# Patient Record
Sex: Female | Born: 1995 | Race: Black or African American | Hispanic: No | Marital: Single | Smoking: Never smoker
Health system: Southern US, Community
[De-identification: ages and names within clinical notes are randomized; demographics above are authoritative.]

## PROBLEM LIST (undated history)

## (undated) DIAGNOSIS — J302 Other seasonal allergic rhinitis: Secondary | ICD-10-CM

---

## 2013-08-30 ENCOUNTER — Encounter (HOSPITAL_BASED_OUTPATIENT_CLINIC_OR_DEPARTMENT_OTHER): Payer: Self-pay | Admitting: *Deleted

## 2013-08-30 ENCOUNTER — Emergency Department (HOSPITAL_BASED_OUTPATIENT_CLINIC_OR_DEPARTMENT_OTHER)
Admission: EM | Admit: 2013-08-30 | Discharge: 2013-08-30 | Disposition: A | Discharge status: Home / Self Care | Payer: Medicaid Other | Attending: Emergency Medicine | Admitting: Emergency Medicine

## 2013-08-30 DIAGNOSIS — M545 Low back pain, unspecified: Secondary | ICD-10-CM | POA: Insufficient documentation

## 2013-08-30 DIAGNOSIS — R079 Chest pain, unspecified: Secondary | ICD-10-CM | POA: Insufficient documentation

## 2013-08-30 DIAGNOSIS — M549 Dorsalgia, unspecified: Secondary | ICD-10-CM

## 2013-08-30 MED ORDER — ACETAMINOPHEN-CODEINE #3 300-30 MG PO TABS: 1.0000 | ORAL_TABLET | Freq: Four times a day (QID) | ORAL | Status: DC | PRN

## 2013-08-30 NOTE — ED Notes (Signed)
Patient states she has a two or greater month history of lower back and chest pain.  States she has been seen at [facility] for the same, and her work up has been negative.

## 2013-08-30 NOTE — ED Notes (Signed)
In to d/c pt, pt requested RTW in 2 days-EDP Nanvati notified-advised pt could RTW today-pt notified

## 2013-08-30 NOTE — ED Provider Notes (Signed)
CSN: [id number]     Arrival date & time 08/30/13  1337 History   First MD Initiated Contact with Patient 08/30/13 1410     Chief Complaint  Patient presents with  . Back Pain   (Consider location/radiation/quality/duration/timing/severity/associated sxs/prior Treatment) HPI Comments: Pt comes in with cc of back pain, chest pain. Pt has no medical hx. No family hx of premature CAD. Pain is intermittent x 2 months, usually comes about every other day. The chest pain is sharp, non exertional, not pleuritic and resolves with pressure to the chest. No cough. Back pain is lateral lumbar. No uti like sx. No vaginal discharge, bleeding.   Patient is a 17 y.o. female presenting with back pain. The history is provided by the patient.  Back Pain Associated symptoms: chest pain   Associated symptoms: no abdominal pain, no dysuria and no headaches     History reviewed. No pertinent past medical history. History reviewed. No pertinent past surgical history. No family history on file. History  Substance Use Topics  . Smoking status: Never Smoker   . Smokeless tobacco: Not on file  . Alcohol Use: No   OB History   Grav Para Term Preterm Abortions TAB SAB Ect Mult Living                 Review of Systems  Constitutional: Negative for activity change.  HENT: Negative for neck pain.   Respiratory: Negative for shortness of breath.   Cardiovascular: Positive for chest pain.  Gastrointestinal: Negative for nausea, vomiting and abdominal pain.  Genitourinary: Negative for dysuria.  Musculoskeletal: Positive for back pain.  Neurological: Negative for headaches.    Allergies  Review of patient's allergies indicates no known allergies.  Home Medications   Current Outpatient Rx  Name  Route  Sig  Dispense  Refill  . fluticasone (FLONASE) 50 MCG/ACT nasal spray   Nasal   Place 2 sprays into the nose as needed for rhinitis.         Marland Kitchen loratadine (CLARITIN) 10 MG tablet   Oral   Take 10  mg by mouth daily.         . medroxyPROGESTERone (DEPO-PROVERA) 150 MG/ML injection   Intramuscular   Inject 150 mg into the muscle every 3 (three) months.         Marland Kitchen acetaminophen-codeine (TYLENOL #3) 300-30 MG per tablet   Oral   Take 1 tablet by mouth every 6 (six) hours as needed for pain.   15 tablet   0    BP 122/53  Pulse 89  Temp(Src) 98 F (36.7 C) (Oral)  Resp 18  Ht 5\' 6"  (1.676 m)  Wt 150 lb (68.04 kg)  BMI 24.22 kg/m2  SpO2 100% Physical Exam  Nursing note and vitals reviewed. Constitutional: She is oriented to person, place, and time. She appears well-developed and well-nourished.  HENT:  Head: Normocephalic and atraumatic.  Eyes: EOM are normal. Pupils are equal, round, and reactive to light.  Neck: Neck supple.  Cardiovascular: Normal rate, regular rhythm and normal heart sounds.   No murmur heard. Pulmonary/Chest: Effort normal. No respiratory distress.  Abdominal: Soft. She exhibits no distension. There is no tenderness. There is no rebound and no guarding.  Musculoskeletal:  Pt has tenderness over the lumbar region No step offs, no erythema. Pt has 2+ patellar reflex bilaterally. Able to discriminate between sharp and dull. Able to ambulate   Neurological: She is alert and oriented to person, place, and time.  Skin:  Skin is warm and dry.    ED Course  Procedures (including critical care time) Labs Review Labs Reviewed - No data to display Imaging Review No results found.  MDM   1. Back pain    Pt comes in with cc of back pain, chest pain.  Intermittent back and chest pain.  She has 0 red flags for back or chest disease that can be emergent or life threatning.  No uti like sx, no no GU complains.  Requested Up[employer] - but she has work at 3 om, and rather get out by then.  Chest paib- No concerning fam hx/ PERC and WELLS score are 0. Xray at [facility] per patient 2 weeks ago - negative.  Will ask for PCP f/u. She has insurance and  a PCP.  Derwood Kaplan, MD 08/30/13 1510

## 2015-05-27 ENCOUNTER — Emergency Department (HOSPITAL_BASED_OUTPATIENT_CLINIC_OR_DEPARTMENT_OTHER)
Admission: EM | Admit: 2015-05-27 | Discharge: 2015-05-27 | Disposition: A | Discharge status: Home / Self Care | Payer: Medicaid Other | Attending: Emergency Medicine | Admitting: Emergency Medicine

## 2015-05-27 ENCOUNTER — Encounter (HOSPITAL_BASED_OUTPATIENT_CLINIC_OR_DEPARTMENT_OTHER): Payer: Self-pay | Admitting: Emergency Medicine

## 2015-05-27 DIAGNOSIS — D72829 Elevated white blood cell count, unspecified: Secondary | ICD-10-CM | POA: Diagnosis not present

## 2015-05-27 DIAGNOSIS — Z79899 Other long term (current) drug therapy: Secondary | ICD-10-CM | POA: Insufficient documentation

## 2015-05-27 DIAGNOSIS — Z8709 Personal history of other diseases of the respiratory system: Secondary | ICD-10-CM | POA: Diagnosis not present

## 2015-05-27 DIAGNOSIS — N390 Urinary tract infection, site not specified: Secondary | ICD-10-CM | POA: Insufficient documentation

## 2015-05-27 DIAGNOSIS — Z3202 Encounter for pregnancy test, result negative: Secondary | ICD-10-CM | POA: Diagnosis not present

## 2015-05-27 DIAGNOSIS — R300 Dysuria: Secondary | ICD-10-CM | POA: Diagnosis present

## 2015-05-27 HISTORY — DX: Other seasonal allergic rhinitis: J30.2

## 2015-05-27 LAB — URINALYSIS, ROUTINE W REFLEX MICROSCOPIC
GLUCOSE, UA: NEGATIVE mg/dL
KETONES UR: 15 mg/dL — AB
Nitrite: NEGATIVE
PROTEIN: 100 mg/dL — AB
Specific Gravity, Urine: 1.03 (ref 1.005–1.030)
Urobilinogen, UA: 1 mg/dL (ref 0.0–1.0)
pH: 6 (ref 5.0–8.0)

## 2015-05-27 LAB — URINE MICROSCOPIC-ADD ON

## 2015-05-27 LAB — PREGNANCY, URINE: PREG TEST UR: NEGATIVE

## 2015-05-27 MED ORDER — CEPHALEXIN 500 MG PO CAPS: 500.0000 mg | ORAL_CAPSULE | Freq: Two times a day (BID) | ORAL | Status: DC

## 2015-05-27 MED ORDER — PHENAZOPYRIDINE HCL 200 MG PO TABS: 200.0000 mg | ORAL_TABLET | Freq: Three times a day (TID) | ORAL | Status: DC

## 2015-05-27 NOTE — ED Notes (Signed)
Burning with urination x 2 days

## 2015-05-27 NOTE — ED Provider Notes (Signed)
CSN: [id number]     Arrival date & time 05/27/15  1716 History   First MD Initiated Contact with Patient 05/27/15 1855     Chief Complaint  Patient presents with  . Dysuria     (Consider location/radiation/quality/duration/timing/severity/associated sxs/prior Treatment) Patient is a 19 y.o. female presenting with dysuria. The history is provided by the patient. No language interpreter was used.  Dysuria Pain quality:  Burning Pain severity:  Moderate Onset quality:  Sudden Duration:  2 days Timing:  Intermittent Progression:  Unchanged Chronicity:  Recurrent Recent urinary tract infections: yes (3-4 weeks ago, does not recall name of antibiotic she was on but symptoms resolved completely)   Relieved by:  None tried Worsened by:  Nothing tried Ineffective treatments:  None tried Urinary symptoms: frequent urination   Associated symptoms: no abdominal pain, no fever, no flank pain, no nausea, no vaginal discharge and no vomiting    Pt is a [place] female presenting to ED with c/o dysuria, urgency and frequency that started 2 days ago. Pt denies abdominal pain, hematuria, back pain, fever, chills, n/v. Denies vaginal symptoms. Denies concern for STD.  LMP: 05/13/15  Past Medical History  Diagnosis Date  . Seasonal allergies    History reviewed. No pertinent past surgical history. No family history on file. History  Substance Use Topics  . Smoking status: Never Smoker   . Smokeless tobacco: Not on file  . Alcohol Use: No   OB History    No data available     Review of Systems  Constitutional: Negative for fever, diaphoresis, appetite change and fatigue.  Gastrointestinal: Negative for nausea, vomiting, abdominal pain and diarrhea.  Genitourinary: Positive for dysuria, urgency and frequency. Negative for hematuria, flank pain, decreased urine volume, vaginal bleeding, vaginal discharge, vaginal pain, menstrual problem and pelvic pain.  Musculoskeletal: Negative for myalgias and  back pain.  All other systems reviewed and are negative.     Allergies  Review of patient's allergies indicates no known allergies.  Home Medications   Prior to Admission medications   Medication Sig Start Date End Date Taking? Authorizing Provider  acetaminophen-codeine (TYLENOL #3) 300-30 MG per tablet Take 1 tablet by mouth every 6 (six) hours as needed for pain. 08/30/13   Derwood Kaplan, MD  cephALEXin (KEFLEX) 500 MG capsule Take 1 capsule (500 mg total) by mouth 2 (two) times daily. For 7 days 05/27/15   Junius Finner, PA-C  fluticasone [facility]) 50 MCG/ACT nasal spray Place 2 sprays into the nose as needed for rhinitis.    Historical Provider, MD  loratadine (CLARITIN) 10 MG tablet Take 10 mg by mouth daily.    Historical Provider, MD  medroxyPROGESTERone (DEPO-PROVERA) 150 MG/ML injection Inject 150 mg into the muscle every 3 (three) months.    Historical Provider, MD  phenazopyridine (PYRIDIUM) 200 MG tablet Take 1 tablet (200 mg total) by mouth 3 (three) times daily. 05/27/15   Junius Finner, PA-C   BP 110/72 mmHg  Pulse 60  Temp(Src) 98.6 F (37 C) (Oral)  Resp 16  Ht  (1.626 m)  Wt 160 lb (72.576 kg)  BMI 27.45 kg/m2  SpO2 100%  LMP 05/13/2015 (Approximate) Physical Exam  Constitutional: She appears well-developed and well-nourished. No distress.  HENT:  Head: Normocephalic and atraumatic.  Eyes: Conjunctivae are normal. No scleral icterus.  Neck: Normal range of motion.  Cardiovascular: Normal rate, regular rhythm and normal heart sounds.   Pulmonary/Chest: Effort normal and breath sounds normal. No respiratory distress. She has  no wheezes. She has no rales. She exhibits no tenderness.  Abdominal: Soft. Bowel sounds are normal. She exhibits no distension and no mass. There is no tenderness. There is no rebound, no guarding and no CVA tenderness.  Soft, non-distended, non-tender. No CVAT  Genitourinary:  Pt declined  Musculoskeletal: Normal range of motion.   Neurological: She is alert.  Skin: Skin is warm and dry. She is not diaphoretic.  Nursing note and vitals reviewed.   ED Course  Procedures (including critical care time) Labs Review Labs Reviewed  URINALYSIS, ROUTINE W REFLEX MICROSCOPIC (NOT AT [facility]) - Abnormal; Notable for the following:    APPearance TURBID (*)    Hgb urine dipstick LARGE (*)    Bilirubin Urine SMALL (*)    Ketones, ur 15 (*)    Protein, ur 100 (*)    Leukocytes, UA LARGE (*)    All other components within normal limits  URINE MICROSCOPIC-ADD ON - Abnormal; Notable for the following:    Squamous Epithelial / LPF FEW (*)    Bacteria, UA FEW (*)    All other components within normal limits  URINE CULTURE  PREGNANCY, URINE  GC/CHLAMYDIA PROBE AMP ([place]) NOT AT [facility]    Imaging Review No results found.   EKG Interpretation None      MDM   Final diagnoses:  UTI (lower urinary tract infection)   Pt is a [place]18yo female presenting to ED with c/o burning with urination. Reports hx of UTI.  Pt appears well, non-toxic,afebrile, NAD. Abdomen is soft, non-tender. No CVAT  UA: evidence of UTI with Hgb large leukocytes and 10-20 WBC. Sperm is present. Pt declined Pelvic exam stating she has never had one before. Denies vaginal symptoms. Urine culture sent. GC/Chlamydia via Urine also sent.   Rx: keflex. Advised to f/u with PCP in 3-4 days for symptom recheck if not improving. Return precautions provided. Pt verbalized understanding and agreement with tx plan.   Junius Finnerrin O'Malley, PA-C 05/27/15 1952  Nelva Nayobert Beaton, MD 06/02/15 2153

## 2015-05-28 LAB — GC/CHLAMYDIA PROBE AMP (~~LOC~~) NOT AT ARMC
Chlamydia: POSITIVE — AB
Neisseria Gonorrhea: POSITIVE — AB

## 2015-05-29 ENCOUNTER — Telehealth (HOSPITAL_BASED_OUTPATIENT_CLINIC_OR_DEPARTMENT_OTHER): Payer: Self-pay | Admitting: Emergency Medicine

## 2015-05-29 LAB — URINE CULTURE: Colony Count: >=100000

## 2015-05-29 NOTE — Telephone Encounter (Signed)
Chart handoff to EDP for treatment plan for chlamydia and gonorrhea

## 2015-05-30 ENCOUNTER — Telehealth: Payer: Self-pay | Admitting: Emergency Medicine

## 2015-05-30 NOTE — Telephone Encounter (Signed)
Post ED Visit - Positive Culture Follow-up  Culture report reviewed by antimicrobial stewardship pharmacist: []  Wes Dulaney, Pharm.D., BCPS [x]  Celedonio MiyamotoJeremy Frens, Pharm.D., BCPS []  Georgina PillionElizabeth Martin, Pharm.D., BCPS []  NormalMinh Pham, [address]Ph[address]D., BCPS, AAHIVP []  Estella HuskMichelle Turner, Pharm.D., BCPS, AAHIVP []  Elder CyphersLorie Poole, [address]Pharm[address]D., BCPS  Positive Urine culture Treated with Cephalexin, organism sensitive to the same and no further patient follow-up is required at this time.  Jiles HaroldGammons, Yanis Larin Chaney 05/30/2015, 1:47 PM

## 2015-05-31 ENCOUNTER — Telehealth: Payer: Self-pay | Admitting: Emergency Medicine

## 2015-06-02 ENCOUNTER — Telehealth (HOSPITAL_BASED_OUTPATIENT_CLINIC_OR_DEPARTMENT_OTHER): Payer: Self-pay | Admitting: Emergency Medicine

## 2015-07-26 ENCOUNTER — Emergency Department (HOSPITAL_BASED_OUTPATIENT_CLINIC_OR_DEPARTMENT_OTHER)
Admission: EM | Admit: 2015-07-26 | Discharge: 2015-07-26 | Disposition: A | Discharge status: Home / Self Care | Payer: Medicaid Other | Attending: Emergency Medicine | Admitting: Emergency Medicine

## 2015-07-26 ENCOUNTER — Encounter (HOSPITAL_BASED_OUTPATIENT_CLINIC_OR_DEPARTMENT_OTHER): Payer: Self-pay | Admitting: *Deleted

## 2015-07-26 DIAGNOSIS — Z8709 Personal history of other diseases of the respiratory system: Secondary | ICD-10-CM | POA: Insufficient documentation

## 2015-07-26 DIAGNOSIS — R51 Headache: Secondary | ICD-10-CM | POA: Diagnosis not present

## 2015-07-26 DIAGNOSIS — R112 Nausea with vomiting, unspecified: Secondary | ICD-10-CM | POA: Diagnosis not present

## 2015-07-26 DIAGNOSIS — Z3202 Encounter for pregnancy test, result negative: Secondary | ICD-10-CM | POA: Insufficient documentation

## 2015-07-26 DIAGNOSIS — Z79899 Other long term (current) drug therapy: Secondary | ICD-10-CM | POA: Insufficient documentation

## 2015-07-26 DIAGNOSIS — R519 Headache, unspecified: Secondary | ICD-10-CM

## 2015-07-26 LAB — URINALYSIS, ROUTINE W REFLEX MICROSCOPIC
Bilirubin Urine: NEGATIVE
GLUCOSE, UA: NEGATIVE mg/dL
HGB URINE DIPSTICK: NEGATIVE
KETONES UR: NEGATIVE mg/dL
LEUKOCYTES UA: NEGATIVE
Nitrite: NEGATIVE
PH: 7 (ref 5.0–8.0)
Protein, ur: NEGATIVE mg/dL
Specific Gravity, Urine: 1.026 (ref 1.005–1.030)
Urobilinogen, UA: 1 mg/dL (ref 0.0–1.0)

## 2015-07-26 LAB — PREGNANCY, URINE: Preg Test, Ur: NEGATIVE

## 2015-07-26 MED ORDER — METOCLOPRAMIDE HCL 5 MG/ML IJ SOLN
10.0000 mg | Freq: Once | INTRAMUSCULAR | Status: AC
  Administered 2015-07-26: 10 mg via INTRAVENOUS
  Filled 2015-07-26: qty 2

## 2015-07-26 MED ORDER — SODIUM CHLORIDE 0.9 % IV SOLN
Freq: Once | INTRAVENOUS | Status: AC
  Administered 2015-07-26: 15:00:00 via INTRAVENOUS

## 2015-07-26 MED ORDER — KETOROLAC TROMETHAMINE 30 MG/ML IJ SOLN
30.0000 mg | Freq: Once | INTRAMUSCULAR | Status: AC
  Administered 2015-07-26: 30 mg via INTRAVENOUS
  Filled 2015-07-26: qty 1

## 2015-07-26 MED ORDER — DIPHENHYDRAMINE HCL 50 MG/ML IJ SOLN
25.0000 mg | Freq: Once | INTRAMUSCULAR | Status: AC
  Administered 2015-07-26: 25 mg via INTRAVENOUS
  Filled 2015-07-26: qty 1

## 2015-07-26 NOTE — ED Provider Notes (Signed)
CSN: [id number]     Arrival date & time 07/26/15  1335 History   First MD Initiated Contact with Patient 07/26/15 1354     Chief Complaint  Patient presents with  . Emesis  . Headache     (Consider location/radiation/quality/duration/timing/severity/associated sxs/prior Treatment) Patient is a 19 y.o. female presenting with headaches. The history is provided by the patient. No language interpreter was used.  Headache Pain location:  Generalized Quality:  Dull Radiates to:  Does not radiate Severity currently:  5/10 Severity at highest:  5/10 Onset quality:  Gradual Timing:  Constant Progression:  Worsening Chronicity:  New Similar to prior headaches: no   Relieved by:  Nothing Worsened by:  Nothing Ineffective treatments:  None tried Associated symptoms: vomiting   Associated symptoms: no fever   Risk factors: lifestyle not sedentary     Past Medical History  Diagnosis Date  . Seasonal allergies    History reviewed. No pertinent past surgical history. No family history on file. History  Substance Use Topics  . Smoking status: Never Smoker   . Smokeless tobacco: Not on file  . Alcohol Use: No   OB History    No data available     Review of Systems  Constitutional: Negative for fever.  Gastrointestinal: Positive for vomiting.  Neurological: Positive for headaches.  All other systems reviewed and are negative.     Allergies  Review of patient's allergies indicates no known allergies.  Home Medications   Prior to Admission medications   Medication Sig Start Date End Date Taking? Authorizing Provider  acetaminophen-codeine (TYLENOL #3) 300-30 MG per tablet Take 1 tablet by mouth every 6 (six) hours as needed for pain. 08/30/13   Derwood Kaplan, MD  cephALEXin (KEFLEX) 500 MG capsule Take 1 capsule (500 mg total) by mouth 2 (two) times daily. For 7 days 05/27/15   Junius Finner, PA-C  fluticasone [facility]) 50 MCG/ACT nasal spray Place 2 sprays into the nose as  needed for rhinitis.    Historical Provider, MD  loratadine (CLARITIN) 10 MG tablet Take 10 mg by mouth daily.    Historical Provider, MD  medroxyPROGESTERone (DEPO-PROVERA) 150 MG/ML injection Inject 150 mg into the muscle every 3 (three) months.    Historical Provider, MD  phenazopyridine (PYRIDIUM) 200 MG tablet Take 1 tablet (200 mg total) by mouth 3 (three) times daily. 05/27/15   Junius Finner, PA-C   BP 137/93 mmHg  Pulse 68  Temp(Src) 98.1 F (36.7 C) (Oral)  Resp 18  Ht  (1.626 m)  Wt 160 lb (72.576 kg)  BMI 27.45 kg/m2  SpO2 99%  LMP 06/10/2015 Physical Exam  Constitutional: She is oriented to person, place, and time. She appears well-developed and well-nourished.  HENT:  Head: Normocephalic and atraumatic.  Right Ear: External ear normal.  Left Ear: External ear normal.  Nose: Nose normal.  Mouth/Throat: Oropharynx is clear and moist.  Eyes: Conjunctivae and EOM are normal. Pupils are equal, round, and reactive to light.  Neck: Normal range of motion.  Cardiovascular: Normal rate and normal heart sounds.   Pulmonary/Chest: Effort normal.  Abdominal: She exhibits no distension.  Musculoskeletal: Normal range of motion.  Neurological: She is alert and oriented to person, place, and time.  Skin: Skin is warm.  Psychiatric: She has a normal mood and affect.  Nursing note and vitals reviewed.   ED Course  Procedures (including critical care time) Labs Review Labs Reviewed  URINALYSIS, ROUTINE W REFLEX MICROSCOPIC (NOT AT [facility])  PREGNANCY, URINE    Imaging Review No results found.   EKG Interpretation None      MDM   Final diagnoses:  Acute nonintractable headache, unspecified headache type    Pt reports nausea and headache resolved with Migraine cocktail. avs   Lonia Skinner [place], PA-C 07/26/15 1811  Nelva Nay, MD 07/27/15 [phone]

## 2015-07-26 NOTE — Discharge Instructions (Signed)

## 2015-07-26 NOTE — ED Notes (Signed)
Reports headache and vomiting since yesterday

## 2015-09-19 ENCOUNTER — Emergency Department (HOSPITAL_BASED_OUTPATIENT_CLINIC_OR_DEPARTMENT_OTHER)
Admission: EM | Admit: 2015-09-19 | Discharge: 2015-09-20 | Disposition: A | Discharge status: Other Acute Inpatient Hospital | Payer: Medicaid Other | Attending: Emergency Medicine | Admitting: Emergency Medicine

## 2015-09-19 ENCOUNTER — Emergency Department (HOSPITAL_BASED_OUTPATIENT_CLINIC_OR_DEPARTMENT_OTHER): Payer: Medicaid Other

## 2015-09-19 ENCOUNTER — Encounter (HOSPITAL_BASED_OUTPATIENT_CLINIC_OR_DEPARTMENT_OTHER): Payer: Self-pay | Admitting: Emergency Medicine

## 2015-09-19 DIAGNOSIS — Z79899 Other long term (current) drug therapy: Secondary | ICD-10-CM | POA: Insufficient documentation

## 2015-09-19 DIAGNOSIS — R1030 Lower abdominal pain, unspecified: Secondary | ICD-10-CM | POA: Diagnosis present

## 2015-09-19 DIAGNOSIS — R52 Pain, unspecified: Secondary | ICD-10-CM

## 2015-09-19 DIAGNOSIS — Z3202 Encounter for pregnancy test, result negative: Secondary | ICD-10-CM | POA: Insufficient documentation

## 2015-09-19 DIAGNOSIS — N730 Acute parametritis and pelvic cellulitis: Secondary | ICD-10-CM | POA: Diagnosis not present

## 2015-09-19 DIAGNOSIS — Z7951 Long term (current) use of inhaled steroids: Secondary | ICD-10-CM | POA: Insufficient documentation

## 2015-09-19 DIAGNOSIS — N7011 Chronic salpingitis: Secondary | ICD-10-CM | POA: Insufficient documentation

## 2015-09-19 LAB — URINALYSIS, ROUTINE W REFLEX MICROSCOPIC
Glucose, UA: NEGATIVE mg/dL
NITRITE: NEGATIVE
PH: 5.5 (ref 5.0–8.0)
Protein, ur: NEGATIVE mg/dL
SPECIFIC GRAVITY, URINE: 1.018 (ref 1.005–1.030)
Urobilinogen, UA: 1 mg/dL (ref 0.0–1.0)

## 2015-09-19 LAB — CBC WITH DIFFERENTIAL/PLATELET
BASOS ABS: 0 10*3/uL (ref 0.0–0.1)
Basophils Relative: 0 %
Eosinophils Absolute: 0 10*3/uL (ref 0.0–0.7)
Eosinophils Relative: 0 %
HEMATOCRIT: 40.7 % (ref 36.0–46.0)
Hemoglobin: 13.6 g/dL (ref 12.0–15.0)
LYMPHS PCT: 11 %
Lymphs Abs: 2.6 10*3/uL (ref 0.7–4.0)
MCH: 29.6 pg (ref 26.0–34.0)
MCHC: 33.4 g/dL (ref 30.0–36.0)
MCV: 88.5 fL (ref 78.0–100.0)
Monocytes Absolute: 2 10*3/uL — ABNORMAL HIGH (ref 0.1–1.0)
Monocytes Relative: 8 %
NEUTROS ABS: 19.7 10*3/uL — AB (ref 1.7–7.7)
Neutrophils Relative %: 81 %
PLATELETS: 322 10*3/uL (ref 150–400)
RBC: 4.6 MIL/uL (ref 3.87–5.11)
RDW: 13.4 % (ref 11.5–15.5)
WBC: 24.3 10*3/uL — AB (ref 4.0–10.5)

## 2015-09-19 LAB — WET PREP, GENITAL
Trich, Wet Prep: NONE SEEN
Yeast Wet Prep HPF POC: NONE SEEN

## 2015-09-19 LAB — COMPREHENSIVE METABOLIC PANEL
ALT: 8 U/L — ABNORMAL LOW (ref 14–54)
ANION GAP: 11 (ref 5–15)
AST: 14 U/L — ABNORMAL LOW (ref 15–41)
Albumin: 4.4 g/dL (ref 3.5–5.0)
Alkaline Phosphatase: 53 U/L (ref 38–126)
BUN: 7 mg/dL (ref 6–20)
CHLORIDE: 100 mmol/L — AB (ref 101–111)
CO2: 26 mmol/L (ref 22–32)
Calcium: 9.2 mg/dL (ref 8.9–10.3)
Creatinine, Ser: 0.63 mg/dL (ref 0.44–1.00)
GFR calc non Af Amer: >60 mL/min (ref >60)
Glucose, Bld: 93 mg/dL (ref 65–99)
POTASSIUM: 3.6 mmol/L (ref 3.5–5.1)
SODIUM: 137 mmol/L (ref 135–145)
Total Bilirubin: 1.1 mg/dL (ref 0.3–1.2)
Total Protein: 7.9 g/dL (ref 6.5–8.1)

## 2015-09-19 LAB — CBG MONITORING, ED: GLUCOSE-CAPILLARY: 84 mg/dL (ref 65–99)

## 2015-09-19 LAB — URINE MICROSCOPIC-ADD ON

## 2015-09-19 LAB — LIPASE, BLOOD: Lipase: 12 U/L — ABNORMAL LOW (ref 22–51)

## 2015-09-19 LAB — I-STAT CG4 LACTIC ACID, ED: LACTIC ACID, VENOUS: 0.52 mmol/L (ref 0.5–2.0)

## 2015-09-19 LAB — PREGNANCY, URINE: PREG TEST UR: NEGATIVE

## 2015-09-19 MED ORDER — SODIUM CHLORIDE 0.9 % IV BOLUS (SEPSIS)
1000.0000 mL | Freq: Once | INTRAVENOUS | Status: AC
  Administered 2015-09-19: 1000 mL via INTRAVENOUS

## 2015-09-19 MED ORDER — CEFTRIAXONE SODIUM 250 MG IJ SOLR
250.0000 mg | Freq: Once | INTRAMUSCULAR | Status: AC
  Administered 2015-09-19: 250 mg via INTRAMUSCULAR
  Filled 2015-09-19: qty 250

## 2015-09-19 MED ORDER — DOXYCYCLINE HYCLATE 100 MG PO TABS
100.0000 mg | ORAL_TABLET | Freq: Once | ORAL | Status: AC
  Administered 2015-09-19: 100 mg via ORAL
  Filled 2015-09-19: qty 1

## 2015-09-19 MED ORDER — ACETAMINOPHEN 325 MG PO TABS
650.0000 mg | ORAL_TABLET | Freq: Once | ORAL | Status: AC
  Administered 2015-09-19: 650 mg via ORAL
  Filled 2015-09-19: qty 2

## 2015-09-19 MED ORDER — METRONIDAZOLE 500 MG PO TABS
500.0000 mg | ORAL_TABLET | Freq: Once | ORAL | Status: AC
  Administered 2015-09-19: 500 mg via ORAL
  Filled 2015-09-19: qty 1

## 2015-09-19 NOTE — ED Notes (Signed)
abd pain since yesterday. Denies urinary symptoms. Was at [facility] hospital but states wait was too long. Pt states lower abd pain. Denies discharge.

## 2015-09-19 NOTE — ED Provider Notes (Signed)
CSN: [id number]     Arrival date & time 09/19/15  2034 History  This chart was scribed for non-physician practitioner, Wynetta Emery, PA-C working with Cathren Laine, MD by Angelene Giovanni, ED Scribe. The patient was seen in room MH01/MH01 and the patient's care was started at 8:48 PM .    Chief Complaint  Patient presents with  . Abdominal Pain   The history is provided by the patient. No language interpreter was used.   HPI Comments: Michele Mcdonald is a 19 y.o. female who presents to the Emergency Department complaining of gradually worsening moderate lower abdominal pain that is 7/10 upon movement onset yesterday. She reports associated one episode of vomiting of clear fluids that is non-bilious and non-bloody. She also reports associated constipation. She denies any fever, vaginal discharge/bleeding, fever, chills, or urinary symptoms. She denies that she is worried about any STDs. She denies any hx of abdominal surgeries. Pt reports NKDA.   Obgyn: [facility] in [place]  Past Medical History  Diagnosis Date  . Seasonal allergies    History reviewed. No pertinent past surgical history. No family history on file. Social History  Substance Use Topics  . Smoking status: Never Smoker   . Smokeless tobacco: None  . Alcohol Use: No   OB History    No data available     Review of Systems A complete 10 system review of systems was obtained and all systems are negative except as noted in the HPI and PMH.     Allergies  Review of patient's allergies indicates no known allergies.  Home Medications   Prior to Admission medications   Medication Sig Start Date End Date Taking? Authorizing Provider  acetaminophen-codeine (TYLENOL #3) 300-30 MG per tablet Take 1 tablet by mouth every 6 (six) hours as needed for pain. 08/30/13   Derwood Kaplan, MD  cephALEXin (KEFLEX) 500 MG capsule Take 1 capsule (500 mg total) by mouth 2 (two) times daily. For 7 days 05/27/15   Junius Finner, PA-C   fluticasone [facility]) 50 MCG/ACT nasal spray Place 2 sprays into the nose as needed for rhinitis.    Historical Provider, MD  loratadine (CLARITIN) 10 MG tablet Take 10 mg by mouth daily.    Historical Provider, MD  medroxyPROGESTERone (DEPO-PROVERA) 150 MG/ML injection Inject 150 mg into the muscle every 3 (three) months.    Historical Provider, MD  phenazopyridine (PYRIDIUM) 200 MG tablet Take 1 tablet (200 mg total) by mouth 3 (three) times daily. 05/27/15   Junius Finner, PA-C   BP 131/76 mmHg  Pulse 108  Temp(Src) 98.3 F (36.8 C) (Oral)  Resp 18  Ht  (1.626 m)  Wt 150 lb (68.04 kg)  BMI 25.73 kg/m2  SpO2 100%  LMP 08/28/2015 Physical Exam  Constitutional: She is oriented to person, place, and time. She appears well-developed and well-nourished. No distress.  HENT:  Head: Normocephalic and atraumatic.  Mouth/Throat: Oropharynx is clear and moist.  Eyes: Conjunctivae and EOM are normal. Pupils are equal, round, and reactive to light.  Neck: Normal range of motion.  Cardiovascular: Normal rate, regular rhythm and intact distal pulses.   Pulmonary/Chest: Effort normal and breath sounds normal. No respiratory distress. She has no wheezes. She has no rales. She exhibits no tenderness.  Abdominal: Soft. Bowel sounds are normal. She exhibits no distension and no mass. There is tenderness. There is no rebound and no guarding.  Tenderness to palpation in the left lower quadrant and suprapubic area. Rovsing, psoas, obturator Eulah Pont  sign negative.  Genitourinary:  No CVA tenderness palpation  Ellik exam a chaperoned by nurse:  No rashes or lesions, there is a thick yellow green homogenous discharge. Positive cervical motion but no adnexal tenderness  Musculoskeletal: Normal range of motion.  Neurological: She is alert and oriented to person, place, and time.  Skin: She is not diaphoretic.  Psychiatric: She has a normal mood and affect.  Nursing note and vitals reviewed.   ED  Course  Procedures (including critical care time) DIAGNOSTIC STUDIES: Oxygen Saturation is 100% on RA, normal by my interpretation.    COORDINATION OF CARE: 8:52 PM- Pt advised of plan for treatment and pt agrees.    Labs Review Labs Reviewed - No data to display  Imaging Review No results found. Wynetta Emery, PA-C has personally reviewed and evaluated these images and lab results as part of her medical decision-making.   EKG Interpretation None      MDM   Final diagnoses:  PID (acute pelvic inflammatory disease)  Hydrosalpinx    Filed Vitals:   09/19/15 2041  BP: 131/76  Pulse: 108  Temp: 98.3 F (36.8 C)  TempSrc: Oral  Resp: 18  Height:  (1.626 m)  Weight: 150 lb (68.04 kg)  SpO2: 100%    Medications  sodium chloride 0.9 % bolus 1,000 mL (0 mLs Intravenous Stopped 09/19/15 2247)  acetaminophen (TYLENOL) tablet 650 mg (650 mg Oral Given 09/19/15 2205)  cefTRIAXone (ROCEPHIN) injection 250 mg (250 mg Intramuscular Given 09/19/15 2205)  doxycycline (VIBRA-TABS) tablet 100 mg (100 mg Oral Given 09/19/15 2205)  metroNIDAZOLE (FLAGYL) tablet 500 mg (500 mg Oral Given 09/19/15 2205)    Michele Mcdonald is a pleasant 19 y.o. female presenting with suprapubic and left lower quadrant pain, single episode of emesis. Care everywhere shows the patient has a Linam count of 25,000, not pregnant, urinalysis with 80 ketones. There was no chemistries ordered. Patient will be given an IV, will perform pelvic exam, and ultrasound to rule out torsion/TOA.   Ultrasound shows a possible hydrosalpinx on the left. Pelvic exam is consistent with PID, patient started on antibiotics. Considering this patient's Coller count of 25 and possible hydrosalpinx I do not think she is appropriate for discharge home patient's OB/GYN has been difficult to reach.  OB/GYN consult from Dr. Adrian Blackwater appreciated: He states that this patient does warrant observation admission and recommends that we didn't  at [facility] so that she can have continuity of care. Attempting to reach [place] [facility] and this patient's OB/GYN  OB/GYN consult from Annye Rusk of [facility] states that he'll take this patient and although he does not formally take call for Dr. Okey Dupre. Patient will be transferred to [place] [facility]. States that patient does not need to be given IV antibiotics here but he will started on the other side.  I personally performed the services described in this documentation, which was scribed in my presence. The recorded information has been reviewed and is accurate.   Wynetta Emery, PA-C 09/19/15 2326  Cathren Laine, MD 09/19/15 [phone]

## 2015-09-20 DIAGNOSIS — R1030 Lower abdominal pain, unspecified: Secondary | ICD-10-CM | POA: Diagnosis present

## 2015-09-20 DIAGNOSIS — N730 Acute parametritis and pelvic cellulitis: Secondary | ICD-10-CM | POA: Diagnosis not present

## 2015-09-20 DIAGNOSIS — Z7951 Long term (current) use of inhaled steroids: Secondary | ICD-10-CM | POA: Diagnosis not present

## 2015-09-20 DIAGNOSIS — N7011 Chronic salpingitis: Secondary | ICD-10-CM | POA: Diagnosis not present

## 2015-09-20 DIAGNOSIS — Z3202 Encounter for pregnancy test, result negative: Secondary | ICD-10-CM | POA: Diagnosis not present

## 2015-09-20 DIAGNOSIS — Z79899 Other long term (current) drug therapy: Secondary | ICD-10-CM | POA: Diagnosis not present

## 2015-09-21 LAB — RPR: RPR Ser Ql: NONREACTIVE

## 2015-09-21 LAB — URINE CULTURE

## 2015-09-21 LAB — HIV ANTIBODY (ROUTINE TESTING W REFLEX): HIV SCREEN 4TH GENERATION: NONREACTIVE

## 2015-09-22 LAB — GC/CHLAMYDIA PROBE AMP (~~LOC~~) NOT AT ARMC
Chlamydia: NEGATIVE
Neisseria Gonorrhea: NEGATIVE

## 2016-07-20 ENCOUNTER — Encounter (HOSPITAL_BASED_OUTPATIENT_CLINIC_OR_DEPARTMENT_OTHER): Payer: Self-pay

## 2016-07-20 ENCOUNTER — Emergency Department (HOSPITAL_BASED_OUTPATIENT_CLINIC_OR_DEPARTMENT_OTHER)
Admission: EM | Admit: 2016-07-20 | Discharge: 2016-07-20 | Disposition: A | Discharge status: Home / Self Care | Payer: No Typology Code available for payment source | Attending: Emergency Medicine | Admitting: Emergency Medicine

## 2016-07-20 DIAGNOSIS — Y999 Unspecified external cause status: Secondary | ICD-10-CM | POA: Insufficient documentation

## 2016-07-20 DIAGNOSIS — Y92410 Unspecified street and highway as the place of occurrence of the external cause: Secondary | ICD-10-CM | POA: Insufficient documentation

## 2016-07-20 DIAGNOSIS — S161XXA Strain of muscle, fascia and tendon at neck level, initial encounter: Secondary | ICD-10-CM | POA: Insufficient documentation

## 2016-07-20 DIAGNOSIS — Y9389 Activity, other specified: Secondary | ICD-10-CM | POA: Diagnosis not present

## 2016-07-20 DIAGNOSIS — S199XXA Unspecified injury of neck, initial encounter: Secondary | ICD-10-CM | POA: Diagnosis present

## 2016-07-20 MED ORDER — NAPROXEN 250 MG PO TABS
500.0000 mg | ORAL_TABLET | Freq: Once | ORAL | Status: AC
  Administered 2016-07-20: 500 mg via ORAL
  Filled 2016-07-20: qty 2

## 2016-07-20 MED ORDER — HYDROCODONE-ACETAMINOPHEN 5-325 MG PO TABS
1.0000 | ORAL_TABLET | Freq: Once | ORAL | Status: AC
  Administered 2016-07-20: 1 via ORAL
  Filled 2016-07-20: qty 1

## 2016-07-20 MED ORDER — METHOCARBAMOL 500 MG PO TABS: 500.0000 mg | ORAL_TABLET | Freq: Two times a day (BID) | ORAL | 0 refills | Status: DC

## 2016-07-20 MED ORDER — CYCLOBENZAPRINE HCL 10 MG PO TABS
10.0000 mg | ORAL_TABLET | Freq: Once | ORAL | Status: AC
  Administered 2016-07-20: 10 mg via ORAL
  Filled 2016-07-20: qty 1

## 2016-07-20 MED ORDER — NAPROXEN 500 MG PO TABS: 500.0000 mg | ORAL_TABLET | Freq: Two times a day (BID) | ORAL | 0 refills | Status: DC

## 2016-07-20 MED ORDER — HYDROCODONE-ACETAMINOPHEN 5-325 MG PO TABS: 1.0000 | ORAL_TABLET | ORAL | 0 refills | Status: DC | PRN

## 2016-07-20 MED ORDER — IBUPROFEN 800 MG PO TABS
800.0000 mg | ORAL_TABLET | Freq: Once | ORAL | Status: DC
  Filled 2016-07-20: qty 1

## 2016-07-20 NOTE — ED Notes (Signed)
Pt made aware to return if symptoms worsen or if any life threatening symptoms occur.  Pt made aware not to drive or go to work while taking narcotic pain medication.    
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
## 2016-07-27 IMAGING — DX DG ABDOMEN ACUTE W/ 1V CHEST
4 series · 4 of 4 positions shown · non-contrast
Comparison: None.

CLINICAL DATA: c/o gradually worsening moderate lower abdominal
pain onset yesterday. She reports associated one episode of vomiting
of clear fluids that is non-bilious and non-bloody. She also reports
associated constipation.

EXAM:
DG ABDOMEN ACUTE W/ 1V CHEST

[chest pa]
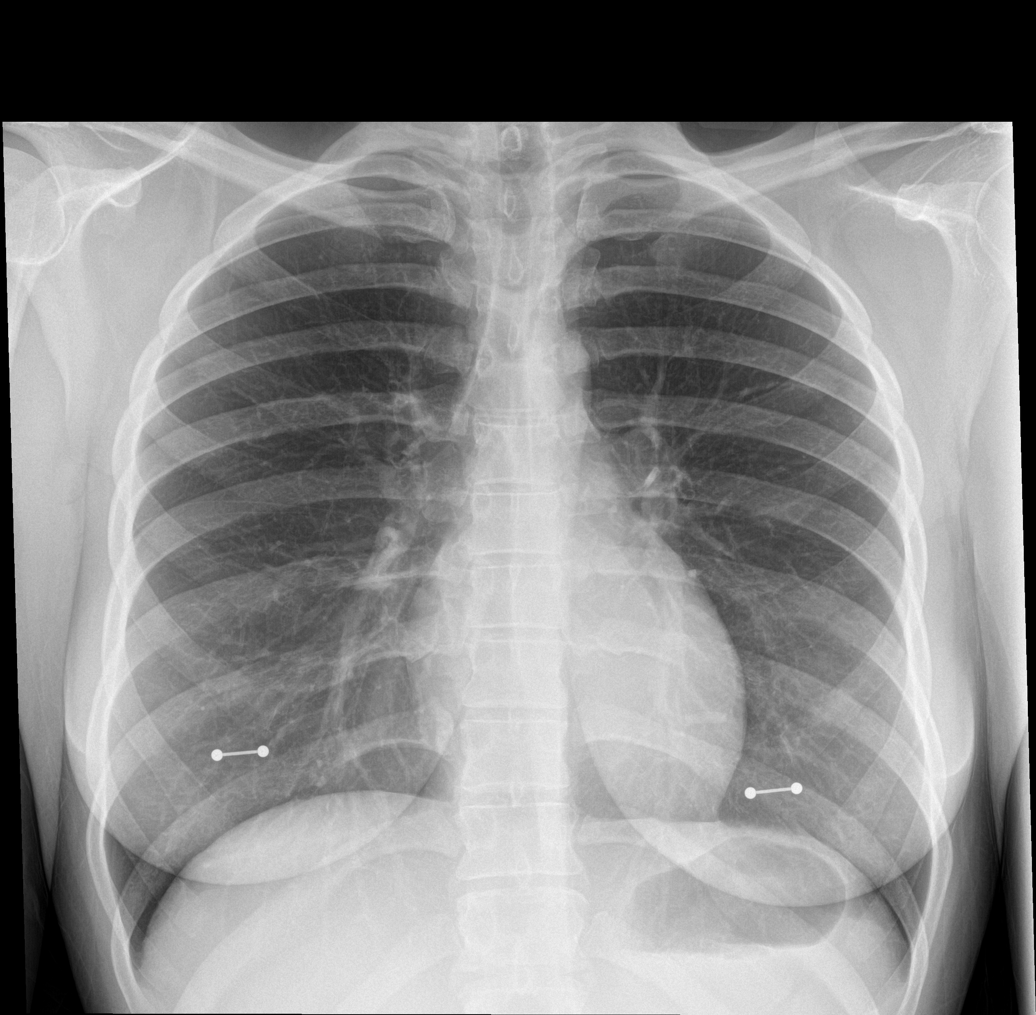

[abdomen erect]
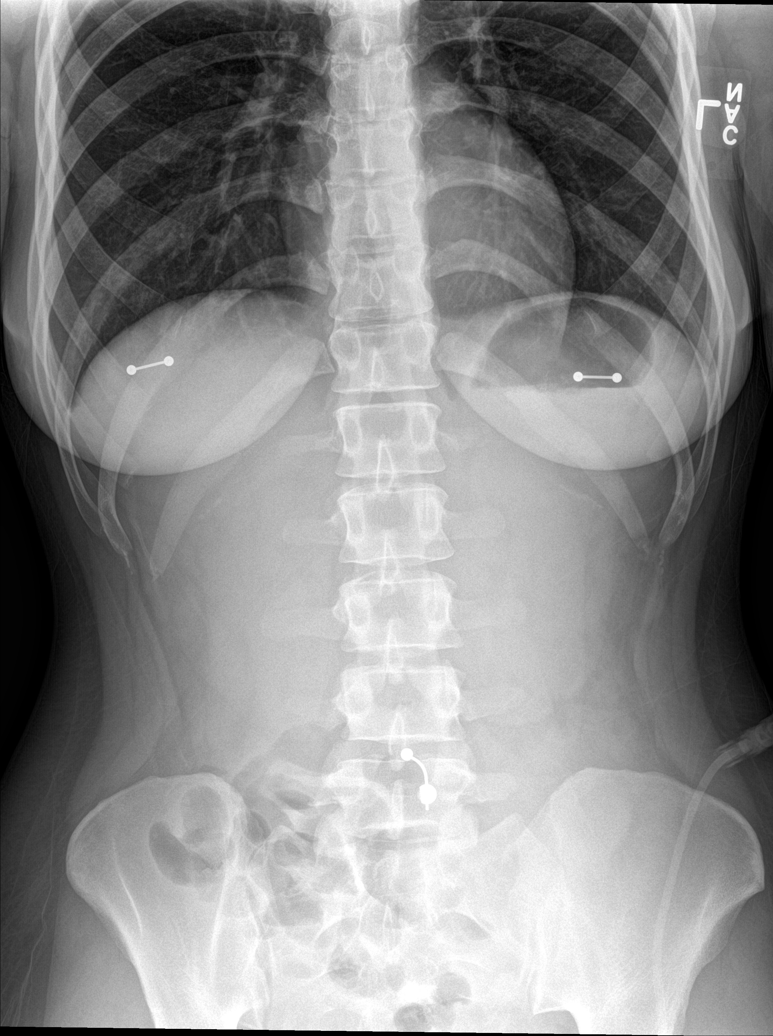

[abdomen supine (1 of 2)]
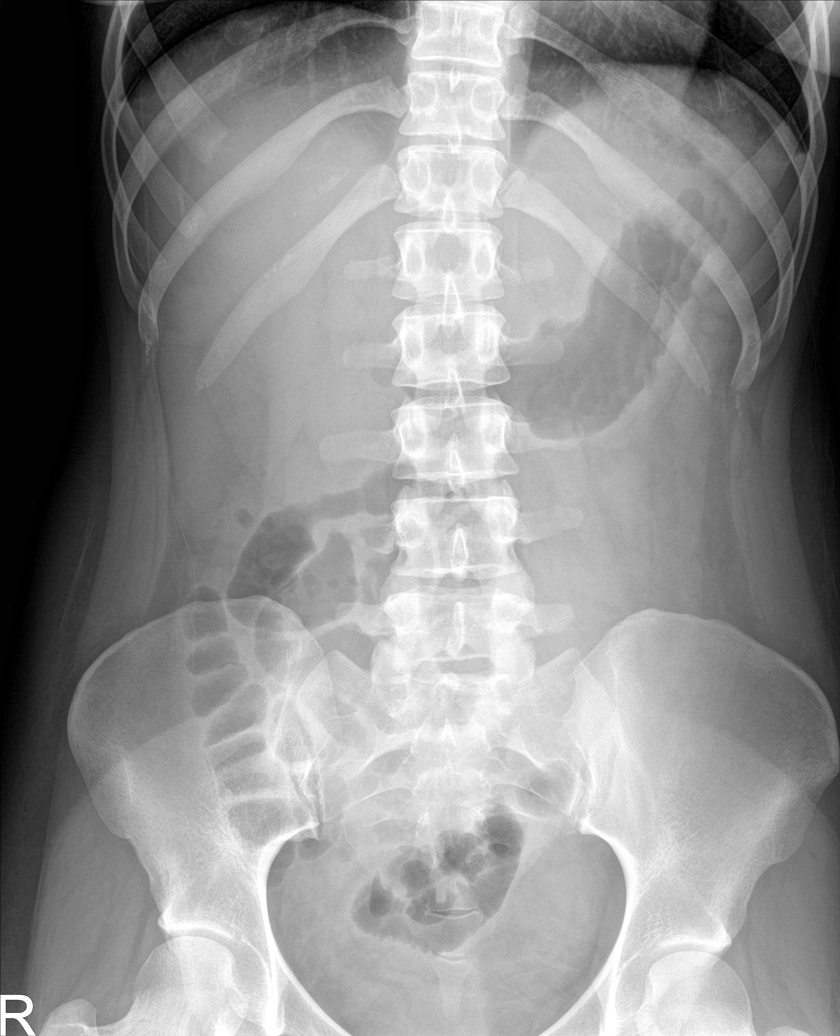

[abdomen supine (2 of 2)]
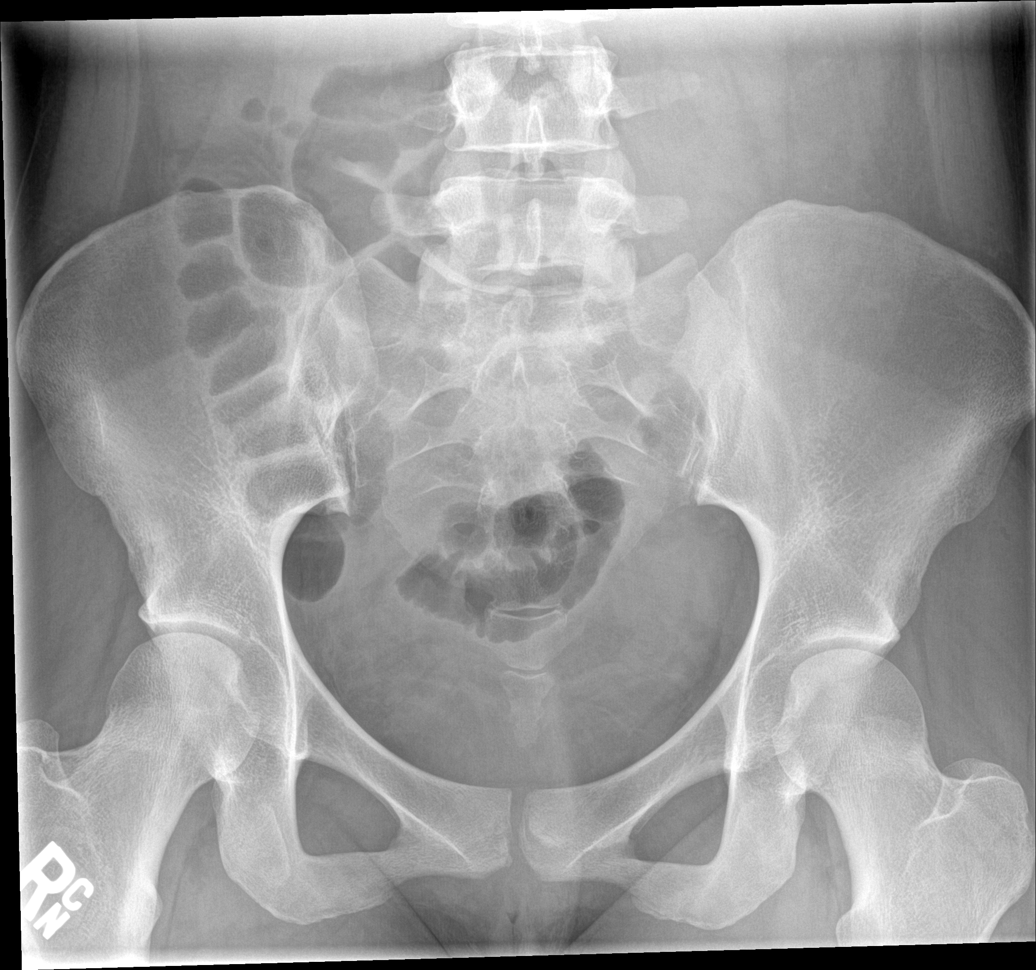

[4 of 4 positions shown; findings below may reference images not displayed]

FINDINGS: Normal heart size and pulmonary vascularity. No focal airspace
disease or consolidation in the lungs. No blunting of costophrenic
angles. No pneumothorax. Mediastinal contours appear intact.

Scattered gas and stool in the colon. No small or large bowel
distention. No free intra-abdominal air. No abnormal air-fluid
levels. No radiopaque stones. Visualized bones appear intact.
IMPRESSION: No evidence of active pulmonary disease. Normal nonobstructive bowel
gas pattern.

## 2017-06-11 ENCOUNTER — Encounter (HOSPITAL_BASED_OUTPATIENT_CLINIC_OR_DEPARTMENT_OTHER): Payer: Self-pay

## 2017-06-11 ENCOUNTER — Emergency Department (HOSPITAL_BASED_OUTPATIENT_CLINIC_OR_DEPARTMENT_OTHER)
Admission: EM | Admit: 2017-06-11 | Discharge: 2017-06-11 | Disposition: A | Discharge status: Home / Self Care | Payer: Medicaid Other | Attending: Emergency Medicine | Admitting: Emergency Medicine

## 2017-06-11 DIAGNOSIS — Z79899 Other long term (current) drug therapy: Secondary | ICD-10-CM | POA: Insufficient documentation

## 2017-06-11 DIAGNOSIS — R112 Nausea with vomiting, unspecified: Secondary | ICD-10-CM

## 2017-06-11 DIAGNOSIS — Z791 Long term (current) use of non-steroidal anti-inflammatories (NSAID): Secondary | ICD-10-CM | POA: Insufficient documentation

## 2017-06-11 DIAGNOSIS — R197 Diarrhea, unspecified: Secondary | ICD-10-CM | POA: Insufficient documentation

## 2017-06-11 LAB — COMPREHENSIVE METABOLIC PANEL
ALBUMIN: 4.7 g/dL (ref 3.5–5.0)
ALT: 12 U/L — ABNORMAL LOW (ref 14–54)
ANION GAP: 8 (ref 5–15)
AST: 19 U/L (ref 15–41)
Alkaline Phosphatase: 57 U/L (ref 38–126)
BILIRUBIN TOTAL: 0.6 mg/dL (ref 0.3–1.2)
BUN: 10 mg/dL (ref 6–20)
CHLORIDE: 102 mmol/L (ref 101–111)
CO2: 28 mmol/L (ref 22–32)
Calcium: 9.2 mg/dL (ref 8.9–10.3)
Creatinine, Ser: 0.59 mg/dL (ref 0.44–1.00)
GFR calc Af Amer: >60 mL/min (ref >60)
GFR calc non Af Amer: >60 mL/min (ref >60)
GLUCOSE: 96 mg/dL (ref 65–99)
POTASSIUM: 4.9 mmol/L (ref 3.5–5.1)
SODIUM: 138 mmol/L (ref 135–145)
TOTAL PROTEIN: 7.6 g/dL (ref 6.5–8.1)

## 2017-06-11 LAB — PREGNANCY, URINE: PREG TEST UR: NEGATIVE

## 2017-06-11 LAB — URINALYSIS, ROUTINE W REFLEX MICROSCOPIC
Bilirubin Urine: NEGATIVE
GLUCOSE, UA: NEGATIVE mg/dL
HGB URINE DIPSTICK: NEGATIVE
KETONES UR: NEGATIVE mg/dL
Leukocytes, UA: NEGATIVE
Nitrite: NEGATIVE
PROTEIN: NEGATIVE mg/dL
Specific Gravity, Urine: 1.018 (ref 1.005–1.030)
pH: 8 (ref 5.0–8.0)

## 2017-06-11 LAB — CBC WITH DIFFERENTIAL/PLATELET
BASOS ABS: 0 10*3/uL (ref 0.0–0.1)
BASOS PCT: 0 %
EOS ABS: 0.2 10*3/uL (ref 0.0–0.7)
Eosinophils Relative: 1 %
HCT: 41 % (ref 36.0–46.0)
Hemoglobin: 14.1 g/dL (ref 12.0–15.0)
Lymphocytes Relative: 14 %
Lymphs Abs: 2.6 10*3/uL (ref 0.7–4.0)
MCH: 30.7 pg (ref 26.0–34.0)
MCHC: 34.4 g/dL (ref 30.0–36.0)
MCV: 89.3 fL (ref 78.0–100.0)
MONO ABS: 1.1 10*3/uL — AB (ref 0.1–1.0)
MONOS PCT: 6 %
NEUTROS ABS: 14.5 10*3/uL — AB (ref 1.7–7.7)
Neutrophils Relative %: 79 %
Platelets: 364 10*3/uL (ref 150–400)
RBC: 4.59 MIL/uL (ref 3.87–5.11)
RDW: 13.2 % (ref 11.5–15.5)
WBC: 18.4 10*3/uL — ABNORMAL HIGH (ref 4.0–10.5)

## 2017-06-11 LAB — LIPASE, BLOOD: Lipase: 18 U/L (ref 11–51)

## 2017-06-11 MED ORDER — ONDANSETRON HCL 4 MG PO TABS: 4.0000 mg | ORAL_TABLET | Freq: Three times a day (TID) | ORAL | 0 refills | Status: DC | PRN

## 2017-06-11 MED ORDER — ONDANSETRON 4 MG PO TBDP
4.0000 mg | ORAL_TABLET | Freq: Once | ORAL | Status: AC
  Administered 2017-06-11: 4 mg via ORAL
  Filled 2017-06-11: qty 1

## 2017-06-11 NOTE — ED Notes (Signed)
Patient was given ginger ale. Call light next to patient to notify EMT if she gets nauseous.

## 2017-06-11 NOTE — ED Provider Notes (Signed)
[facility]   CSN: [id number] Arrival date & time: 06/11/17  1305     History   Chief Complaint Chief Complaint  Patient presents with  . Emesis    HPI Michele Mcdonald is a 21 y.o. female presnting with a 1 day h/o nausea, vomiting, and diarrhea.  Pt states that she woke up this morning feeling like she needed to use the bathroom. Since then, she has had multiple episodes of loose stools, and vomited multiple times. She denies blood in her emesis or stools. No mucous in her stools. She denies fevers, chills, rash, or abd pain. She had no sxs yesterday, and last night for dinner, she ate [employer]. She denies SOB or CP. She denies sore throat, cough, congestion or urinary sxs. She has never had any abd surgeries. No sick contacts. She reports that she is feeling better now than she was this morning.   HPI  Past Medical History:  Diagnosis Date  . Seasonal allergies     There are no active problems to display for this patient.   History reviewed. No pertinent surgical history.  OB History    No data available       Home Medications    Prior to Admission medications   Medication Sig Start Date End Date Taking? Authorizing Provider  HYDROcodone-acetaminophen (NORCO/VICODIN) 5-325 MG tablet Take 1 tablet by mouth every 4 (four) hours as needed. 07/20/16   Rolland PorterJames, Mark, MD  methocarbamol (ROBAXIN) 500 MG tablet Take 1 tablet (500 mg total) by mouth 2 (two) times daily. 07/20/16   Rolland PorterJames, Mark, MD  naproxen (NAPROSYN) 500 MG tablet Take 1 tablet (500 mg total) by mouth 2 (two) times daily. 07/20/16   Rolland PorterJames, Mark, MD  ondansetron (ZOFRAN) 4 MG tablet Take 1 tablet (4 mg total) by mouth every 8 (eight) hours as needed for nausea or vomiting. 06/11/17   Arthor CaptainHarris, Abigail, PA-C    Family History History reviewed. No pertinent family history.  Social History Social History  Substance Use Topics  . Smoking status: Never Smoker  . Smokeless tobacco: Never  Used  . Alcohol use No     Allergies   Patient has no known allergies.   Review of Systems Review of Systems  Constitutional: Negative for chills and fever.  Gastrointestinal: Positive for diarrhea, nausea and vomiting. Negative for abdominal pain and blood in stool.     Physical Exam Updated Vital Signs BP 119/71 (BP Location: Right Arm)   Pulse 65   Temp 98.7 F (37.1 C) (Oral)   Resp 16   Ht 5\' 3"  (1.6 m)   Wt 65.8 kg (145 lb)   LMP 05/23/2017   SpO2 100%   BMI 25.69 kg/m   Physical Exam  Constitutional: She is oriented to person, place, and time. She appears well-developed and well-nourished. No distress.  HENT:  Head: Normocephalic and atraumatic.  Eyes: Conjunctivae and EOM are normal. Pupils are equal, round, and reactive to light.  Neck: Normal range of motion. Neck supple.  Cardiovascular: Normal rate, regular rhythm, normal heart sounds and intact distal pulses.   Pulmonary/Chest: Effort normal and breath sounds normal. No respiratory distress.  Abdominal: Soft. Bowel sounds are normal. She exhibits no distension and no mass. There is no tenderness. There is no rebound, no guarding, no tenderness at McBurney's point and negative Murphy's sign.  Musculoskeletal: Normal range of motion.  Neurological: She is alert and oriented to person, place, and time.  Skin: Skin is warm  and dry. No rash noted. She is not diaphoretic.  Psychiatric: She has a normal mood and affect.     ED Treatments / Results  Labs (all labs ordered are listed, but only abnormal results are displayed) Labs Reviewed  CBC WITH DIFFERENTIAL/PLATELET - Abnormal; Notable for the following:       Result Value   WBC 18.4 (*)    Neutro Abs 14.5 (*)    Monocytes Absolute 1.1 (*)    All other components within normal limits  COMPREHENSIVE METABOLIC PANEL - Abnormal; Notable for the following:    ALT 12 (*)    All other components within normal limits  URINALYSIS, ROUTINE W REFLEX  MICROSCOPIC  PREGNANCY, URINE  LIPASE, BLOOD    EKG  EKG Interpretation None       Radiology No results found.  Procedures Procedures (including critical care time)  Medications Ordered in ED Medications  ondansetron (ZOFRAN-ODT) disintegrating tablet 4 mg (4 mg Oral Given 06/11/17 1656)     Initial Impression / Assessment and Plan / ED Course  I have reviewed the triage vital signs and the nursing notes.  Pertinent labs & imaging results that were available during my care of the patient were reviewed by me and considered in my medical decision making (see chart for details).     Pt appears well and in no distress during evaluation. She states she is already feeling better. No abd pain or fevers, so low suspicion for bacterial infectious process at this time including cholecystitis or appendicitis. Pregnancy test negative. Slightly elevated WBC. Will give zofran and try a PO challenge. If pt tolerates well, will consider discharge.   Patient refused Zofran in the ER, as she did not feel that she needed it. She tolerated PO challenge well, and agreed to plans for discharge. Return precautions given.  Final Clinical Impressions(s) / ED Diagnoses   Final diagnoses:  Nausea vomiting and diarrhea    New Prescriptions Discharge Medication List as of 06/11/2017  5:02 PM    START taking these medications   Details  ondansetron (ZOFRAN) 4 MG tablet Take 1 tablet (4 mg total) by mouth every 8 (eight) hours as needed for nausea or vomiting., Starting Sat 06/11/2017, Print         [place], [place], PA-C 06/11/17 1808    Nira Conn, MD 06/11/17 [phone]

## 2017-06-11 NOTE — Discharge Instructions (Signed)
Take zofran as needed for nausea or vomiting. Follow up with PCP if your nausea or diarrhea has not improved in there next 5 days.  Eat a bland diet-- avoid greasy, spicy or acidic foods. Try to avoid sugary drinks. Stay well hydrated.  Return to the ED if you develop fevers, chill, belly pain or persistent nausea

## 2017-06-11 NOTE — ED Triage Notes (Signed)
Pt reports that at 10am she developed nausea, vomiting, diarrhea. Denies rash, fever, cough, or pain.

## 2018-02-05 ENCOUNTER — Emergency Department (HOSPITAL_BASED_OUTPATIENT_CLINIC_OR_DEPARTMENT_OTHER)
Admission: EM | Admit: 2018-02-05 | Discharge: 2018-02-05 | Disposition: A | Discharge status: Home / Self Care | Payer: Self-pay | Attending: Emergency Medicine | Admitting: Emergency Medicine

## 2018-02-05 ENCOUNTER — Encounter (HOSPITAL_BASED_OUTPATIENT_CLINIC_OR_DEPARTMENT_OTHER): Payer: Self-pay | Admitting: Emergency Medicine

## 2018-02-05 ENCOUNTER — Other Ambulatory Visit: Payer: Self-pay

## 2018-02-05 DIAGNOSIS — H9311 Tinnitus, right ear: Secondary | ICD-10-CM | POA: Insufficient documentation

## 2018-02-05 DIAGNOSIS — Z79899 Other long term (current) drug therapy: Secondary | ICD-10-CM | POA: Insufficient documentation

## 2018-02-05 DIAGNOSIS — H9201 Otalgia, right ear: Secondary | ICD-10-CM

## 2018-02-05 MED ORDER — CETIRIZINE-PSEUDOEPHEDRINE ER 5-120 MG PO TB12: 1.0000 | ORAL_TABLET | Freq: Every day | ORAL | 0 refills | Status: DC

## 2018-02-05 MED ORDER — ACETAMINOPHEN 500 MG PO TABS: 500.0000 mg | ORAL_TABLET | Freq: Four times a day (QID) | ORAL | 0 refills | Status: DC | PRN

## 2018-02-05 NOTE — ED Triage Notes (Signed)
R ear pain with the occasional ringing sound x 2 weeks.

## 2018-02-05 NOTE — ED Provider Notes (Signed)
[facility] EMERGENCY DEPARTMENT Provider Note   CSN: [id number] Arrival date & time: 02/05/18  1125     History   Chief Complaint Chief Complaint  Patient presents with  . Otalgia    HPI Michele Mcdonald is a 22 y.o. female with history of seasonal allergies who presents with a several month history of ringing or whooshing in her ear and a 2-week history of intermittent right ear pain.  She denies any fever, nasal congestion, or ear drainage.  She reports the ringing is worse when she is at home and trying to sleep.  She has not tried any medication at home for symptoms.  Patient denies taking any medications in general.  She also denies any smoking, alcohol use, or illicit drug use.  HPI  Past Medical History:  Diagnosis Date  . Seasonal allergies     There are no active problems to display for this patient.   History reviewed. No pertinent surgical history.  OB History    No data available       Home Medications    Prior to Admission medications   Medication Sig Start Date End Date Taking? Authorizing Provider  acetaminophen (TYLENOL) 500 MG tablet Take 1 tablet (500 mg total) by mouth every 6 (six) hours as needed. 02/05/18   Richie Bonanno, Waylan Boga, PA-C  cetirizine-pseudoephedrine (ZYRTEC-D) 5-120 MG tablet Take 1 tablet by mouth daily. 02/05/18   Angelyn Osterberg, Waylan Boga, PA-C  HYDROcodone-acetaminophen (NORCO/VICODIN) 5-325 MG tablet Take 1 tablet by mouth every 4 (four) hours as needed. 07/20/16   Rolland Porter, MD  methocarbamol (ROBAXIN) 500 MG tablet Take 1 tablet (500 mg total) by mouth 2 (two) times daily. 07/20/16   Rolland Porter, MD  naproxen (NAPROSYN) 500 MG tablet Take 1 tablet (500 mg total) by mouth 2 (two) times daily. 07/20/16   Rolland Porter, MD  ondansetron (ZOFRAN) 4 MG tablet Take 1 tablet (4 mg total) by mouth every 8 (eight) hours as needed for nausea or vomiting. 06/11/17   Arthor Captain, PA-C    Family History No family history on file.  Social  History Social History   Tobacco Use  . Smoking status: Never Smoker  . Smokeless tobacco: Never Used  Substance Use Topics  . Alcohol use: No  . Drug use: No     Allergies   Patient has no known allergies.   Review of Systems Review of Systems  Constitutional: Negative for fever.  HENT: Positive for ear pain. Negative for congestion, ear discharge, sinus pressure, sinus pain and sore throat.   Neurological: Negative for weakness and numbness.     Physical Exam Updated Vital Signs BP 124/71 (BP Location: Right Arm)   Pulse 75   Temp 98.7 F (37.1 C) (Oral)   Resp 16   Ht 5\' 5"  (1.651 m)   Wt 77.1 kg (170 lb)   LMP 01/05/2018   SpO2 100%   BMI 28.29 kg/m   Physical Exam  Constitutional: She appears well-developed and well-nourished. No distress.  HENT:  Head: Normocephalic and atraumatic.  Right Ear: Tympanic membrane normal.  Left Ear: Tympanic membrane normal.  Mouth/Throat: Oropharynx is clear and moist. No oropharyngeal exudate.  Eyes: Conjunctivae are normal. Pupils are equal, round, and reactive to light. Right eye exhibits no discharge. Left eye exhibits no discharge. No scleral icterus.  Neck: Normal range of motion. Neck supple. No thyromegaly present.  Cardiovascular: Normal rate, regular rhythm, normal heart sounds and intact distal pulses. Exam reveals no gallop  and no friction rub.  No murmur heard. Pulmonary/Chest: Effort normal and breath sounds normal. No stridor. No respiratory distress. She has no wheezes. She has no rales.  Musculoskeletal: She exhibits no edema.  Lymphadenopathy:    She has no cervical adenopathy.  Neurological: She is alert. Coordination normal.  CN 3-12 intact; normal sensation throughout; 5/5 strength in all 4 extremities; equal bilateral grip strength  Skin: Skin is warm and dry. No rash noted. She is not diaphoretic. No pallor.  Psychiatric: She has a normal mood and affect.  Nursing note and vitals reviewed.    ED  Treatments / Results  Labs (all labs ordered are listed, but only abnormal results are displayed) Labs Reviewed - No data to display  EKG  EKG Interpretation None       Radiology No results found.  Procedures Procedures (including critical care time)  Medications Ordered in ED Medications - No data to display   Initial Impression / Assessment and Plan / ED Course  I have reviewed the triage vital signs and the nursing notes.  Pertinent labs & imaging results that were available during my care of the patient were reviewed by me and considered in my medical decision making (see chart for details).     Patient with a several month history of tenderness and 2-week history of ear pain.  Bilateral TMs are clear.  Patient denies taking any medications.  Will treat with Zyrtec-D to cover for ear congestion causing patient symptoms, however will refer to ENT for further evaluation and treatment.  Return precautions discussed.  Patient understands and agrees with plan.  Patient vitals stable throughout ED course and discharged in satisfactory condition.  Final Clinical Impressions(s) / ED Diagnoses   Final diagnoses:  Right ear pain  Tinnitus of right ear    ED Discharge Orders        Ordered    cetirizine-pseudoephedrine (ZYRTEC-D) 5-120 MG tablet  Daily     02/05/18 1420    acetaminophen (TYLENOL) 500 MG tablet  Every 6 hours PRN     02/05/18 [address]1420       Tykisha Areola, ConcordAlexandra M, PA-C 02/05/18 1421    Doug SouJacubowitz, Sam, MD 02/05/18 1624

## 2018-02-05 NOTE — Discharge Instructions (Signed)
Take Zyrtec-D 1-2 times daily as needed for ear pain and congestion.  Take Tylenol as prescribed as needed for your pain.  Please follow-up with the ear, nose, and throat doctor, Dr. Annalee GentaShoemaker, for further evaluation and treatment of your symptoms.  Please return to the emergency department if you develop any new or worsening symptoms including fever, severe worsening of your ear pain, ear drainage other than wax, or any other new or concerning symptoms.

## 2018-04-19 ENCOUNTER — Encounter (HOSPITAL_BASED_OUTPATIENT_CLINIC_OR_DEPARTMENT_OTHER): Payer: Self-pay | Admitting: Emergency Medicine

## 2018-04-19 ENCOUNTER — Emergency Department (HOSPITAL_BASED_OUTPATIENT_CLINIC_OR_DEPARTMENT_OTHER)
Admission: EM | Admit: 2018-04-19 | Discharge: 2018-04-19 | Disposition: A | Discharge status: Home / Self Care | Payer: Medicaid Other | Attending: Physician Assistant | Admitting: Physician Assistant

## 2018-04-19 ENCOUNTER — Other Ambulatory Visit: Payer: Self-pay

## 2018-04-19 DIAGNOSIS — H9201 Otalgia, right ear: Secondary | ICD-10-CM | POA: Insufficient documentation

## 2018-04-19 DIAGNOSIS — R0981 Nasal congestion: Secondary | ICD-10-CM | POA: Insufficient documentation

## 2018-04-19 DIAGNOSIS — Z79899 Other long term (current) drug therapy: Secondary | ICD-10-CM | POA: Insufficient documentation

## 2018-04-19 MED ORDER — AMOXICILLIN 500 MG PO CAPS: 500.0000 mg | ORAL_CAPSULE | Freq: Three times a day (TID) | ORAL | 0 refills | Status: DC

## 2018-04-19 NOTE — ED Provider Notes (Signed)
[facility] EMERGENCY DEPARTMENT Provider Note   CSN: [id number] Arrival date & time: 04/19/18  [id number]     History   Chief Complaint Chief Complaint  Patient presents with  . Otalgia    HPI Michele Mcdonald is a 22 y.o. female.  HPI   22 year old female presenting with 11 days of cold. .  Patient says ear pain has gotten worse the last 2-3 days.  She reports it does feel feeling of fullness and a "whooshing".  Patient has no dizziness.  Patient has nasal congestion.  No fevers.  Not a swimmer.  Past Medical History:  Diagnosis Date  . Seasonal allergies     There are no active problems to display for this patient.   History reviewed. No pertinent surgical history.   OB History   None      Home Medications    Prior to Admission medications   Medication Sig Start Date End Date Taking? Authorizing Provider  acetaminophen (TYLENOL) 500 MG tablet Take 1 tablet (500 mg total) by mouth every 6 (six) hours as needed. 02/05/18   Law, Waylan BogaAlexandra M, PA-C  amoxicillin (AMOXIL) 500 MG capsule Take 1 capsule (500 mg total) by mouth 3 (three) times daily. 04/19/18   Romey Cohea Lyn, MD  cetirizine-pseudoephedrine (ZYRTEC-D) 5-120 MG tablet Take 1 tablet by mouth daily. 02/05/18   Law, Waylan BogaAlexandra M, PA-C  HYDROcodone-acetaminophen (NORCO/VICODIN) 5-325 MG tablet Take 1 tablet by mouth every 4 (four) hours as needed. 07/20/16   Rolland PorterJames, Mark, MD  methocarbamol (ROBAXIN) 500 MG tablet Take 1 tablet (500 mg total) by mouth 2 (two) times daily. 07/20/16   Rolland PorterJames, Mark, MD  naproxen (NAPROSYN) 500 MG tablet Take 1 tablet (500 mg total) by mouth 2 (two) times daily. 07/20/16   Rolland PorterJames, Mark, MD  ondansetron (ZOFRAN) 4 MG tablet Take 1 tablet (4 mg total) by mouth every 8 (eight) hours as needed for nausea or vomiting. 06/11/17   Arthor CaptainHarris, Abigail, PA-C    Family History No family history on file.  Social History Social History   Tobacco Use  . Smoking status: Never Smoker  .  Smokeless tobacco: Never Used  Substance Use Topics  . Alcohol use: No  . Drug use: No     Allergies   Patient has no known allergies.   Review of Systems Review of Systems  Constitutional: Negative for activity change, fatigue and fever.  HENT: Positive for congestion, ear pain and sinus pain.   Respiratory: Negative for shortness of breath.   Cardiovascular: Negative for chest pain.  Gastrointestinal: Negative for abdominal pain.  All other systems reviewed and are negative.    Physical Exam Updated Vital Signs BP 112/68 (BP Location: Right Arm)   Pulse 81   Temp 98.4 F (36.9 C) (Oral)   Resp 18   Ht 5\' 3"  (1.6 m)   Wt 72.6 kg (160 lb)   LMP 03/08/2018   SpO2 100%   BMI 28.34 kg/m   Physical Exam  Constitutional: She is oriented to person, place, and time. She appears well-developed and well-nourished.  HENT:  Head: Normocephalic and atraumatic.  Right Ear: External ear normal.  Left Ear: External ear normal.  Mouth/Throat: Oropharynx is clear and moist.  R and L ear with minimal bulging, no erythema, no FB  Eyes: Right eye exhibits no discharge. Left eye exhibits no discharge.  Neck: Normal range of motion. Neck supple.  Cardiovascular: Normal rate and regular rhythm.  Pulmonary/Chest: Effort normal and breath sounds  normal. No respiratory distress.  Neurological: She is oriented to person, place, and time.  Skin: Skin is warm and dry. She is not diaphoretic.  Psychiatric: She has a normal mood and affect.  Nursing note and vitals reviewed.    ED Treatments / Results  Labs (all labs ordered are listed, but only abnormal results are displayed) Labs Reviewed - No data to display  EKG None  Radiology No results found.  Procedures Procedures (including critical care time)  Medications Ordered in ED Medications - No data to display   Initial Impression / Assessment and Plan / ED Course  I have reviewed the triage vital signs and the nursing  notes.  Pertinent labs & imaging results that were available during my care of the patient were reviewed by me and considered in my medical decision making (see chart for details).    22 year old female presenting with 11 days of cold. .  Patient says ear pain has gotten worse the last 2-3 days.  She reports it does feel feeling of fullness and a "whooshing".  Patient has no dizziness.  Patient has nasal congestion.  No fevers.  Not a swimmer.  7:24 AM Patient appears very well.  Will give watch and wait antibiotics.  We will have her follow-up with ENT if not improving.  Final Clinical Impressions(s) / ED Diagnoses   Final diagnoses:  Right ear pain    ED Discharge Orders        Ordered    amoxicillin (AMOXIL) 500 MG capsule  3 times daily     04/19/18 0713       Abelino Derrick, MD 04/19/18 [phone]

## 2018-04-19 NOTE — ED Triage Notes (Signed)
Pt c/o right side ear pain 8/10 for the past few days getting worse today with a HA, pt denies any fever or chills.

## 2018-04-19 NOTE — ED Notes (Signed)
ED provider at the bedside.  

## 2018-04-19 NOTE — Discharge Instructions (Signed)
We see some fullness in her right ear.  We do not think is an infection, but we know is been going on for longer than 10 days, will try a course of antibiotics.  Please follow-up with ear specialist if not improving.

## 2018-06-15 ENCOUNTER — Other Ambulatory Visit: Payer: Self-pay

## 2018-06-15 ENCOUNTER — Emergency Department (HOSPITAL_BASED_OUTPATIENT_CLINIC_OR_DEPARTMENT_OTHER)
Admission: EM | Admit: 2018-06-15 | Discharge: 2018-06-16 | Disposition: A | Discharge status: Home / Self Care | Payer: Medicaid Other | Attending: Emergency Medicine | Admitting: Emergency Medicine

## 2018-06-15 ENCOUNTER — Encounter (HOSPITAL_BASED_OUTPATIENT_CLINIC_OR_DEPARTMENT_OTHER): Payer: Self-pay

## 2018-06-15 DIAGNOSIS — Z79899 Other long term (current) drug therapy: Secondary | ICD-10-CM | POA: Insufficient documentation

## 2018-06-15 DIAGNOSIS — K047 Periapical abscess without sinus: Secondary | ICD-10-CM

## 2018-06-15 NOTE — ED Provider Notes (Signed)
[facility] Provider Note: Lowella Dell, MD, FACEP  CSN: [id number] MRN: [id number] ARRIVAL: 06/15/18 at 2322 ROOM: MH04/MH04   CHIEF COMPLAINT  Dental Pain   HISTORY OF PRESENT ILLNESS  06/15/18 11:55 PM Michele Mcdonald is a 22 y.o. female with about a 1 month history of pain associated with a carious left lower first molar.  She has been to her dentist twice and was placed on amoxicillin and then penicillin.  She states her symptoms improved while she is on the antibiotic but then recur when the course is completed.  Specifically she has pain and adjacent swelling of the affected tooth.  She was referred to an oral surgeon who, she states, refuses to pull the tooth because it is persistently infected.  Her symptoms are mild at the present time but she is afraid they will worsen since she is not currently on an antibiotic.   Past Medical History:  Diagnosis Date  . Seasonal allergies     History reviewed. No pertinent surgical history.  No family history on file.  Social History   Tobacco Use  . Smoking status: Never Smoker  . Smokeless tobacco: Never Used  Substance Use Topics  . Alcohol use: No  . Drug use: No    Prior to Admission medications   Medication Sig Start Date End Date Taking? Authorizing Provider  amoxicillin (AMOXIL) 500 MG capsule Take 1 capsule (500 mg total) by mouth 3 (three) times daily. 04/19/18  Yes Mackuen, Courteney Lyn, MD  HYDROcodone-acetaminophen (NORCO/VICODIN) 5-325 MG tablet Take 1 tablet by mouth every 4 (four) hours as needed. 07/20/16  Yes Rolland Porter, MD  methocarbamol (ROBAXIN) 500 MG tablet Take 1 tablet (500 mg total) by mouth 2 (two) times daily. 07/20/16  Yes Rolland Porter, MD  naproxen (NAPROSYN) 500 MG tablet Take 1 tablet (500 mg total) by mouth 2 (two) times daily. 07/20/16  Yes Rolland Porter, MD  ondansetron (ZOFRAN) 4 MG tablet Take 1 tablet (4 mg total) by mouth every 8 (eight) hours as needed for nausea or vomiting.  06/11/17  Yes Arthor Captain, PA-C  acetaminophen (TYLENOL) 500 MG tablet Take 1 tablet (500 mg total) by mouth every 6 (six) hours as needed. 02/05/18   Law, Waylan Boga, PA-C  cetirizine-pseudoephedrine (ZYRTEC-D) 5-120 MG tablet Take 1 tablet by mouth daily. 02/05/18   Emi Holes, PA-C    Allergies Patient has no known allergies.   REVIEW OF SYSTEMS  Negative except as noted here or in the History of Present Illness.   PHYSICAL EXAMINATION  Initial Vital Signs Blood pressure (!) 128/93, pulse 66, temperature 98.4 F (36.9 C), temperature source Oral, resp. rate 18, height 5\' 4"  (1.626 m), weight 70.3 kg (155 lb), last menstrual period 05/27/2018, SpO2 100 %.  Examination General: Well-developed, well-nourished female in no acute distress; appearance consistent with age of record HENT: normocephalic; atraumatic; carious left lower first molar with adjacent soft tissue knot that is mildly tender Eyes: pupils equal, round and reactive to light; extraocular muscles intact Neck: supple; no lymphadenopathy Heart: regular rate and rhythm Lungs: clear to auscultation bilaterally Abdomen: soft; nondistended; nontender; bowel sounds present Extremities: No deformity; full range of motion; pulses normal Neurologic: Awake, alert and oriented; motor function intact in all extremities and symmetric; no facial droop Skin: Warm and dry Psychiatric: Normal mood and affect   RESULTS  Summary of this visit's results, reviewed by myself:   EKG Interpretation  Date/Time:    Ventricular Rate:  PR Interval:    QRS Duration:   QT Interval:    QTC Calculation:   R Axis:     Text Interpretation:        Laboratory Studies: No results found for this or any previous visit (from the past 24 hour(s)). Imaging Studies: No results found.  ED COURSE and MDM  Nursing notes and initial vitals signs, including pulse oximetry, reviewed.  Vitals:   06/15/18 2328  BP: (!) 128/93  Pulse:  66  Resp: 18  Temp: 98.4 F (36.9 C)  TempSrc: Oral  SpO2: 100%  Weight: 70.3 kg (155 lb)  Height: 5\' 4"  (1.626 m)   We will treat with another course of antibiotics and refer to Dr. Barbette MerinoJensen, the oral surgeon on-call.  PROCEDURES    ED DIAGNOSES     ICD-10-CM   1. Dental infection K04.7        Kemyah Buser, MD 06/16/18 0005

## 2018-06-15 NOTE — ED Triage Notes (Signed)
Pt has left side facial swelling, left lower jaw pain that radiates down left neck

## 2018-06-16 MED ORDER — AMOXICILLIN 500 MG PO CAPS: 500.0000 mg | ORAL_CAPSULE | Freq: Three times a day (TID) | ORAL | 0 refills | Status: DC

## 2018-06-16 MED ORDER — AMOXICILLIN 500 MG PO CAPS
500.0000 mg | ORAL_CAPSULE | Freq: Once | ORAL | Status: AC
  Administered 2018-06-16: 500 mg via ORAL
  Filled 2018-06-16: qty 1

## 2018-06-16 NOTE — ED Notes (Signed)
ED Provider at bedside. 

## 2019-01-09 ENCOUNTER — Emergency Department (HOSPITAL_BASED_OUTPATIENT_CLINIC_OR_DEPARTMENT_OTHER)
Admission: EM | Admit: 2019-01-09 | Discharge: 2019-01-09 | Disposition: A | Discharge status: Home / Self Care | Payer: Medicaid Other | Attending: Emergency Medicine | Admitting: Emergency Medicine

## 2019-01-09 ENCOUNTER — Encounter (HOSPITAL_BASED_OUTPATIENT_CLINIC_OR_DEPARTMENT_OTHER): Payer: Self-pay | Admitting: Emergency Medicine

## 2019-01-09 ENCOUNTER — Other Ambulatory Visit: Payer: Self-pay

## 2019-01-09 DIAGNOSIS — N390 Urinary tract infection, site not specified: Secondary | ICD-10-CM | POA: Insufficient documentation

## 2019-01-09 DIAGNOSIS — Z79899 Other long term (current) drug therapy: Secondary | ICD-10-CM | POA: Insufficient documentation

## 2019-01-09 LAB — COMPREHENSIVE METABOLIC PANEL
ALT: 11 U/L (ref 0–44)
AST: 16 U/L (ref 15–41)
Albumin: 3.7 g/dL (ref 3.5–5.0)
Alkaline Phosphatase: 47 U/L (ref 38–126)
Anion gap: 7 (ref 5–15)
BUN: 6 mg/dL (ref 6–20)
CO2: 27 mmol/L (ref 22–32)
Calcium: 8.7 mg/dL — ABNORMAL LOW (ref 8.9–10.3)
Chloride: 100 mmol/L (ref 98–111)
Creatinine, Ser: 0.74 mg/dL (ref 0.44–1.00)
GFR calc Af Amer: >60 mL/min (ref >60)
GFR calc non Af Amer: >60 mL/min (ref >60)
Glucose, Bld: 116 mg/dL — ABNORMAL HIGH (ref 70–99)
Potassium: 3.3 mmol/L — ABNORMAL LOW (ref 3.5–5.1)
Sodium: 134 mmol/L — ABNORMAL LOW (ref 135–145)
Total Bilirubin: 0.5 mg/dL (ref 0.3–1.2)
Total Protein: 6.8 g/dL (ref 6.5–8.1)

## 2019-01-09 LAB — CBC WITH DIFFERENTIAL/PLATELET
Abs Immature Granulocytes: 0.05 10*3/uL (ref 0.00–0.07)
Basophils Absolute: 0 10*3/uL (ref 0.0–0.1)
Basophils Relative: 0 %
Eosinophils Absolute: 0 10*3/uL (ref 0.0–0.5)
Eosinophils Relative: 0 %
HCT: 39 % (ref 36.0–46.0)
Hemoglobin: 12.6 g/dL (ref 12.0–15.0)
Immature Granulocytes: 0 %
Lymphocytes Relative: 16 %
Lymphs Abs: 2.2 10*3/uL (ref 0.7–4.0)
MCH: 29.5 pg (ref 26.0–34.0)
MCHC: 32.3 g/dL (ref 30.0–36.0)
MCV: 91.3 fL (ref 80.0–100.0)
Monocytes Absolute: 1.6 10*3/uL — ABNORMAL HIGH (ref 0.1–1.0)
Monocytes Relative: 12 %
Neutro Abs: 10.1 10*3/uL — ABNORMAL HIGH (ref 1.7–7.7)
Neutrophils Relative %: 72 %
Platelets: 233 10*3/uL (ref 150–400)
RBC: 4.27 MIL/uL (ref 3.87–5.11)
RDW: 12.8 % (ref 11.5–15.5)
WBC: 14 10*3/uL — ABNORMAL HIGH (ref 4.0–10.5)
nRBC: 0 % (ref 0.0–0.2)

## 2019-01-09 LAB — PREGNANCY, URINE: Preg Test, Ur: NEGATIVE

## 2019-01-09 LAB — URINALYSIS, ROUTINE W REFLEX MICROSCOPIC
Bilirubin Urine: NEGATIVE
Glucose, UA: NEGATIVE mg/dL
Ketones, ur: 15 mg/dL — AB
Nitrite: POSITIVE — AB
Protein, ur: NEGATIVE mg/dL
Specific Gravity, Urine: <1.005 — ABNORMAL LOW (ref 1.005–1.030)
pH: 6 (ref 5.0–8.0)

## 2019-01-09 LAB — URINALYSIS, MICROSCOPIC (REFLEX)

## 2019-01-09 MED ORDER — IBUPROFEN 400 MG PO TABS
400.0000 mg | ORAL_TABLET | Freq: Once | ORAL | Status: AC | PRN
  Administered 2019-01-09: 400 mg via ORAL
  Filled 2019-01-09: qty 1

## 2019-01-09 MED ORDER — ACETAMINOPHEN 500 MG PO TABS: 500.0000 mg | ORAL_TABLET | Freq: Four times a day (QID) | ORAL | 0 refills | Status: AC | PRN

## 2019-01-09 MED ORDER — CEPHALEXIN 500 MG PO CAPS: 500.0000 mg | ORAL_CAPSULE | Freq: Four times a day (QID) | ORAL | 0 refills | Status: AC

## 2019-01-09 MED ORDER — IBUPROFEN 600 MG PO TABS: 600.0000 mg | ORAL_TABLET | Freq: Four times a day (QID) | ORAL | 0 refills | Status: AC | PRN

## 2019-01-09 MED ORDER — CEPHALEXIN 250 MG PO CAPS
500.0000 mg | ORAL_CAPSULE | Freq: Once | ORAL | Status: AC
  Administered 2019-01-09: 500 mg via ORAL
  Filled 2019-01-09: qty 2

## 2019-01-09 MED ORDER — POTASSIUM CHLORIDE CRYS ER 20 MEQ PO TBCR
40.0000 meq | EXTENDED_RELEASE_TABLET | Freq: Once | ORAL | Status: AC
  Administered 2019-01-09: 40 meq via ORAL
  Filled 2019-01-09: qty 2

## 2019-01-09 MED ORDER — VALACYCLOVIR HCL 1 G PO TABS: 1000.0000 mg | ORAL_TABLET | Freq: Two times a day (BID) | ORAL | 0 refills | Status: AC

## 2019-01-09 NOTE — ED Provider Notes (Signed)
[facility] EMERGENCY DEPARTMENT Provider Note   CSN: [id number] Arrival date & time: 01/09/19  1419     History   Chief Complaint Chief Complaint  Patient presents with  . flu like symptoms    HPI Michele Mcdonald is a 23 y.o. female who is previously healthy who presents with a 4-day history of body aches, chills, sweats, headache, fatigue, and urinary frequency.  Patient denies any URI symptoms including cough, sore throat, ear pain, abnormal vaginal discharge or bleeding.  She does have some right sided abdominal pain that is very low in the pelvis.  She denies any back pain.  She took [facility] powders at home without relief.  On my evaluation, patient had ibuprofen at triage, and she is beginning to feel little better.  HPI  Past Medical History:  Diagnosis Date  . Seasonal allergies     There are no active problems to display for this patient.   History reviewed. No pertinent surgical history.   OB History   No obstetric history on file.      Home Medications    Prior to Admission medications   Medication Sig Start Date End Date Taking? Authorizing Provider  acetaminophen (TYLENOL) 500 MG tablet Take 1 tablet (500 mg total) by mouth every 6 (six) hours as needed. 01/09/19   Quan Cybulski, Waylan Boga, PA-C  amoxicillin (AMOXIL) 500 MG capsule Take 1 capsule (500 mg total) by mouth 3 (three) times daily. 06/16/18   Molpus, John, MD  cephALEXin (KEFLEX) 500 MG capsule Take 1 capsule (500 mg total) by mouth 4 (four) times daily for 10 days. 01/09/19 01/19/19  Darnisha Vernet, Waylan Boga, PA-C  cetirizine-pseudoephedrine (ZYRTEC-D) 5-120 MG tablet Take 1 tablet by mouth daily. 02/05/18   Omelia Marquart, Waylan Boga, PA-C  ibuprofen (ADVIL,MOTRIN) 600 MG tablet Take 1 tablet (600 mg total) by mouth every 6 (six) hours as needed. 01/09/19   Jammy Plotkin, Waylan Boga, PA-C  ondansetron (ZOFRAN) 4 MG tablet Take 1 tablet (4 mg total) by mouth every 8 (eight) hours as needed for nausea or vomiting. 06/11/17   Arthor Captain, PA-C    Family History No family history on file.  Social History Social History   Tobacco Use  . Smoking status: Never Smoker  . Smokeless tobacco: Never Used  Substance Use Topics  . Alcohol use: No  . Drug use: No     Allergies   Patient has no known allergies.   Review of Systems Review of Systems  Constitutional: Positive for chills and fever.  HENT: Negative for congestion, ear pain, facial swelling and sore throat.   Respiratory: Negative for cough and shortness of breath.   Cardiovascular: Negative for chest pain.  Gastrointestinal: Positive for abdominal pain. Negative for nausea and vomiting.  Genitourinary: Positive for frequency. Negative for dysuria.  Musculoskeletal: Positive for myalgias. Negative for back pain.  Skin: Negative for rash and wound.  Neurological: Positive for headaches.  Psychiatric/Behavioral: The patient is not nervous/anxious.      Physical Exam Updated Vital Signs BP 114/75 (BP Location: Right Arm)   Pulse 78   Temp 99.3 F (37.4 C) (Oral)   Resp 14   Ht 5\' 4"  (1.626 m)   Wt 66.7 kg   LMP 12/27/2018   SpO2 100%   BMI 25.23 kg/m   Physical Exam Vitals signs and nursing note reviewed.  Constitutional:      General: She is not in acute distress.    Appearance: She is well-developed. She is not  diaphoretic.  HENT:     Head: Normocephalic and atraumatic.     Right Ear: Tympanic membrane normal.     Left Ear: Tympanic membrane normal.     Mouth/Throat:     Pharynx: No oropharyngeal exudate or posterior oropharyngeal erythema.  Eyes:     General: No scleral icterus.       Right eye: No discharge.        Left eye: No discharge.     Conjunctiva/sclera: Conjunctivae normal.     Pupils: Pupils are equal, round, and reactive to light.  Neck:     Musculoskeletal: Normal range of motion and neck supple. No neck rigidity.     Thyroid: No thyromegaly.     Comments: Chin to chest without difficulty Cardiovascular:      Rate and Rhythm: Normal rate and regular rhythm.     Heart sounds: Normal heart sounds. No murmur. No friction rub. No gallop.   Pulmonary:     Effort: Pulmonary effort is normal. No respiratory distress.     Breath sounds: Normal breath sounds. No stridor. No wheezing or rales.  Abdominal:     General: Bowel sounds are normal. There is no distension.     Palpations: Abdomen is soft.     Tenderness: There is abdominal tenderness (mild). There is no guarding or rebound.       Comments: Tenderness resolved on repeat exam after ibuprofen, Keflex, and observation  Lymphadenopathy:     Cervical: No cervical adenopathy.  Skin:    General: Skin is warm and dry.     Coloration: Skin is not pale.     Findings: No rash.  Neurological:     Mental Status: She is alert.     Coordination: Coordination normal.      ED Treatments / Results  Labs (all labs ordered are listed, but only abnormal results are displayed) Labs Reviewed  COMPREHENSIVE METABOLIC PANEL - Abnormal; Notable for the following components:      Result Value   Sodium 134 (*)    Potassium 3.3 (*)    Glucose, Bld 116 (*)    Calcium 8.7 (*)    All other components within normal limits  CBC WITH DIFFERENTIAL/PLATELET - Abnormal; Notable for the following components:   WBC 14.0 (*)    Neutro Abs 10.1 (*)    Monocytes Absolute 1.6 (*)    All other components within normal limits  URINALYSIS, ROUTINE W REFLEX MICROSCOPIC - Abnormal; Notable for the following components:   APPearance HAZY (*)    Specific Gravity, Urine <1.005 (*)    Hgb urine dipstick LARGE (*)    Ketones, ur 15 (*)    Nitrite POSITIVE (*)    Leukocytes, UA SMALL (*)    All other components within normal limits  URINALYSIS, MICROSCOPIC (REFLEX) - Abnormal; Notable for the following components:   Bacteria, UA MANY (*)    All other components within normal limits  URINE CULTURE  PREGNANCY, URINE    EKG None  Radiology No results  found.  Procedures Procedures (including critical care time)  Medications Ordered in ED Medications  ibuprofen (ADVIL,MOTRIN) tablet 400 mg (400 mg Oral Given 01/09/19 1440)  cephALEXin (KEFLEX) capsule 500 mg (500 mg Oral Given 01/09/19 1742)  potassium chloride SA (K-DUR,KLOR-CON) CR tablet 40 mEq (40 mEq Oral Given 01/09/19 1742)     Initial Impression / Assessment and Plan / ED Course  I have reviewed the triage vital signs and the nursing notes.  Pertinent labs & imaging results that were available during my care of the patient were reviewed by me and considered in my medical decision making (see chart for details).     Patient presenting with fever, body aches, and urinary frequency for the past 3 to 4 days.  She is well-appearing.  No signs of meningismus.  Lungs are clear to auscultation.  Patient found to have urinary tract infection.  UA shows large hematuria, positive nitrites, small leukocytes, many bacteria.  Urine culture sent.  Urine pregnancy negative.  Patient has mild leukocytosis at 14.0.  Potassium 3.3, which was replaced in the ED. on repeat exam, patient has no abdominal tenderness.  Despite initial very low right-sided abdominal pain, using shared decision making we will treat UTI and patient is advised to return if she develops severe, localized right lower quadrant pain, intractable vomiting, persistent fever over 100.4, or unrelenting symptoms after 3 to 4 days.  Patient will be given pyelonephritis dosing of Keflex, ibuprofen, and Tylenol.  She understands and agrees with this plan.  Patient vitals stable throughout ED course and is feeling much better after the medications given in the ED.  She is ready to go home.  Final Clinical Impressions(s) / ED Diagnoses   Final diagnoses:  Complicated UTI (urinary tract infection)    ED Discharge Orders         Ordered    cephALEXin (KEFLEX) 500 MG capsule  4 times daily     01/09/19 1802    ibuprofen (ADVIL,MOTRIN) 600  MG tablet  Every 6 hours PRN     01/09/19 1802    acetaminophen (TYLENOL) 500 MG tablet  Every 6 hours PRN     01/09/19 1802           Emi HolesLaw, Aarya Quebedeaux M, PA-C 01/09/19 1806    Rolan BuccoBelfi, Melanie, MD 01/09/19 2056

## 2019-01-09 NOTE — ED Notes (Signed)
ED Provider at bedside. 

## 2019-01-09 NOTE — Discharge Instructions (Signed)
Take Keflex until completed for your urinary tract infection.  I suspect this is beginning to move toward your kidneys and causing you to have the fever and body aches.  It is important that you finish all this antibiotic.  You can alternate ibuprofen and Tylenol as needed for body aches, fevers.  Please return immediately if you develop worsening pain in your right lower quadrant, intractable vomiting, or if your symptoms do not improving in the next 3 to 4 days.  You will be called if there needs to be an antibiotic change based on your urine culture.

## 2019-01-09 NOTE — ED Triage Notes (Signed)
Body aches, fatigue, chills, sweats, headache x4 days.

## 2019-01-13 ENCOUNTER — Telehealth: Payer: Self-pay | Admitting: Emergency Medicine

## 2019-01-13 ENCOUNTER — Telehealth (HOSPITAL_BASED_OUTPATIENT_CLINIC_OR_DEPARTMENT_OTHER): Payer: Self-pay | Admitting: Emergency Medicine

## 2019-01-13 LAB — URINE CULTURE: Culture: >=100000 — AB

## 2019-01-13 NOTE — Telephone Encounter (Signed)
Post ED Visit - Positive Culture Follow-up  Culture report reviewed by antimicrobial stewardship pharmacist:  []  Enzo Bi, Pharm.D. []  Celedonio Miyamoto, [address].D., BCPS AQ-ID []  Garvin Fila, Pharm.D., BCPS []  Georgina Pillion, Pharm.D., BCPS []  [place], [place].D., BCPS, AAHIVP []  Estella Husk, Pharm.D., BCPS, AAHIVP []  Lysle Pearl, PharmD, BCPS []  Phillips Climes, PharmD, BCPS []  Agapito Games, PharmD, BCPS [x]  Verlan Friends, PharmD  Positive urine culture Treated with Cephalexin, organism sensitive to the same and no further patient follow-up is required at this time.  Norm Parcel RN 01/13/2019, 12:56 PM

## 2019-01-24 ENCOUNTER — Encounter (HOSPITAL_BASED_OUTPATIENT_CLINIC_OR_DEPARTMENT_OTHER): Payer: Self-pay | Admitting: *Deleted

## 2019-01-24 ENCOUNTER — Other Ambulatory Visit: Payer: Self-pay

## 2019-01-24 ENCOUNTER — Emergency Department (HOSPITAL_BASED_OUTPATIENT_CLINIC_OR_DEPARTMENT_OTHER)
Admission: EM | Admit: 2019-01-24 | Discharge: 2019-01-24 | Disposition: A | Discharge status: Home / Self Care | Payer: Medicaid Other | Attending: Emergency Medicine | Admitting: Emergency Medicine

## 2019-01-24 DIAGNOSIS — B9689 Other specified bacterial agents as the cause of diseases classified elsewhere: Secondary | ICD-10-CM | POA: Insufficient documentation

## 2019-01-24 DIAGNOSIS — N760 Acute vaginitis: Secondary | ICD-10-CM | POA: Insufficient documentation

## 2019-01-24 DIAGNOSIS — Z79899 Other long term (current) drug therapy: Secondary | ICD-10-CM | POA: Insufficient documentation

## 2019-01-24 DIAGNOSIS — Z202 Contact with and (suspected) exposure to infections with a predominantly sexual mode of transmission: Secondary | ICD-10-CM

## 2019-01-24 LAB — WET PREP, GENITAL
Sperm: NONE SEEN
Trich, Wet Prep: NONE SEEN
YEAST WET PREP: NONE SEEN

## 2019-01-24 LAB — PREGNANCY, URINE: Preg Test, Ur: NEGATIVE

## 2019-01-24 MED ORDER — CEFTRIAXONE SODIUM 250 MG IJ SOLR
250.0000 mg | Freq: Once | INTRAMUSCULAR | Status: AC
  Administered 2019-01-24: 250 mg via INTRAMUSCULAR
  Filled 2019-01-24: qty 250

## 2019-01-24 MED ORDER — METRONIDAZOLE 500 MG PO TABS
500.0000 mg | ORAL_TABLET | Freq: Once | ORAL | Status: AC
  Administered 2019-01-24: 500 mg via ORAL
  Filled 2019-01-24: qty 1

## 2019-01-24 MED ORDER — AZITHROMYCIN 1 G PO PACK
1.0000 g | PACK | Freq: Once | ORAL | Status: AC
  Administered 2019-01-24: 1 g via ORAL
  Filled 2019-01-24: qty 1

## 2019-01-24 MED ORDER — METRONIDAZOLE 500 MG PO TABS: 500.0000 mg | ORAL_TABLET | Freq: Two times a day (BID) | ORAL | 0 refills | Status: AC

## 2019-01-24 NOTE — ED Triage Notes (Signed)
Wants to be checked for std  Denies any sx,  Boyfriend is having penile discharge

## 2019-01-24 NOTE — ED Notes (Signed)
Pelvic setup in room 

## 2019-01-24 NOTE — ED Provider Notes (Signed)
[facility] EMERGENCY DEPARTMENT Provider Note   CSN: [id number] Arrival date & time: 01/24/19  2297     History   Chief Complaint Chief Complaint  Patient presents with  . Exposure to STD    HPI Michele Mcdonald is a 23 y.o. female.  The history is provided by the patient and medical records. No language interpreter was used.  Exposure to STD  This is a recurrent problem. The current episode started yesterday. The problem occurs constantly. The problem has not changed since onset.Pertinent negatives include no chest pain, no abdominal pain, no headaches and no shortness of breath. Nothing aggravates the symptoms. Nothing relieves the symptoms. She has tried nothing for the symptoms. The treatment provided no relief.    Past Medical History:  Diagnosis Date  . Seasonal allergies     There are no active problems to display for this patient.   History reviewed. No pertinent surgical history.   OB History   No obstetric history on file.      Home Medications    Prior to Admission medications   Medication Sig Start Date End Date Taking? Authorizing Provider  acetaminophen (TYLENOL) 500 MG tablet Take 1 tablet (500 mg total) by mouth every 6 (six) hours as needed. 01/09/19   Law, Waylan Boga, PA-C  amoxicillin (AMOXIL) 500 MG capsule Take 1 capsule (500 mg total) by mouth 3 (three) times daily. 06/16/18   Molpus, John, MD  cetirizine-pseudoephedrine (ZYRTEC-D) 5-120 MG tablet Take 1 tablet by mouth daily. 02/05/18   Law, Waylan Boga, PA-C  ibuprofen (ADVIL,MOTRIN) 600 MG tablet Take 1 tablet (600 mg total) by mouth every 6 (six) hours as needed. 01/09/19   Law, Waylan Boga, PA-C  ondansetron (ZOFRAN) 4 MG tablet Take 1 tablet (4 mg total) by mouth every 8 (eight) hours as needed for nausea or vomiting. 06/11/17   Arthor Captain, PA-C    Family History No family history on file.  Social History Social History   Tobacco Use  . Smoking status: Never Smoker  .  Smokeless tobacco: Never Used  Substance Use Topics  . Alcohol use: No  . Drug use: No     Allergies   Patient has no known allergies.   Review of Systems Review of Systems  Constitutional: Negative for chills, diaphoresis, fatigue and fever.  HENT: Negative for ear pain and sore throat.   Eyes: Negative for pain and visual disturbance.  Respiratory: Negative for cough and shortness of breath.   Cardiovascular: Negative for chest pain and palpitations.  Gastrointestinal: Negative for abdominal pain, constipation, diarrhea, nausea and vomiting.  Genitourinary: Positive for vaginal bleeding. Negative for dysuria, hematuria, vaginal discharge and vaginal pain.  Musculoskeletal: Negative for arthralgias, back pain, neck pain and neck stiffness.  Skin: Negative for color change and rash.  Neurological: Negative for dizziness, seizures, syncope and headaches.  Psychiatric/Behavioral: Negative for agitation.  All other systems reviewed and are negative.    Physical Exam Updated Vital Signs BP 121/69 (BP Location: Right Arm)   Pulse 84   Temp 98.5 F (36.9 C) (Oral)   Resp 14   Ht 5\' 5"  (1.651 m)   Wt 63.5 kg   LMP 12/27/2018 (Approximate)   SpO2 100%   BMI 23.30 kg/m   Physical Exam Vitals signs and nursing note reviewed. Exam conducted with a chaperone present.  Constitutional:      General: She is not in acute distress.    Appearance: She is well-developed.  HENT:  Head: Normocephalic and atraumatic.  Eyes:     Conjunctiva/sclera: Conjunctivae normal.  Neck:     Musculoskeletal: Neck supple.  Cardiovascular:     Rate and Rhythm: Normal rate and regular rhythm.     Heart sounds: No murmur.  Pulmonary:     Effort: Pulmonary effort is normal. No respiratory distress.     Breath sounds: Normal breath sounds.  Abdominal:     Palpations: Abdomen is soft.     Tenderness: There is no abdominal tenderness.  Genitourinary:    Labia:        Right: No tenderness.          Left: No tenderness.      Vagina: No foreign body. Vaginal discharge and bleeding present. No erythema or tenderness.     Cervix: Discharge and cervical bleeding present. No cervical motion tenderness.     Uterus: Not tender.      Adnexa:        Right: No tenderness.         Left: No tenderness.    Musculoskeletal:        General: No tenderness.  Skin:    General: Skin is warm and dry.  Neurological:     Mental Status: She is alert.      ED Treatments / Results  Labs (all labs ordered are listed, but only abnormal results are displayed) Labs Reviewed  WET PREP, GENITAL - Abnormal; Notable for the following components:      Result Value   Clue Cells Wet Prep HPF POC PRESENT (*)    WBC, Wet Prep HPF POC MANY (*)    All other components within normal limits  PREGNANCY, URINE  GC/CHLAMYDIA PROBE AMP ([place]) NOT AT [facility]    EKG None  Radiology No results found.  Procedures Procedures (including critical care time)  Medications Ordered in ED Medications  cefTRIAXone (ROCEPHIN) injection 250 mg (has no administration in time range)  azithromycin (ZITHROMAX) powder 1 g (has no administration in time range)  metroNIDAZOLE (FLAGYL) tablet 500 mg (has no administration in time range)     Initial Impression / Assessment and Plan / ED Course  I have reviewed the triage vital signs and the nursing notes.  Pertinent labs & imaging results that were available during my care of the patient were reviewed by me and considered in my medical decision making (see chart for details).     Michele Mcdonald is a 23 y.o. female with a past medical history significant for prior sexual transit infection who presents with possible exposure to STI.  Patient reports that her boyfriend is here in the emergency department as well with penile discharge and recent chlamydia infection.  She would like to be checked for it and treated.  She denies any abdominal pain, vaginal discharge,  fevers, chills, or other complaints.  She denies any symptoms.  She reports she is on her menstrual cycle currently.  She has no other complaints today.  On exam, patient does have vaginal bleeding and her pelvic exam with a chaperone.  No cervical motion tenderness or adnexal tenderness, doubt PID.  Swabs were collected.  Exam otherwise unremarkable clear lungs nontender abdomen and nontender chest.  Wet prep revealed bacterial vaginosis with clue cells.  Patient was treated with Flagyl.  Pregnancy test was negative.  Given her likely exposure to sexually transmitted infection and recent chlamydial infection, she will be treated with antibiotics for presumed cervicitis.  With lack of tenderness, doubt PID  or TOA at this time.  Patient given Rocephin and azithromycin in the emergency department I will be given prescription for Flagyl.  Patient will follow-up with PCP and OB/GYN.  Patient was understanding return precautions and plan of care.  Patient discharged in good condition with no complaints.   Final Clinical Impressions(s) / ED Diagnoses   Final diagnoses:  STD exposure  Bacterial vaginosis    ED Discharge Orders         Ordered    metroNIDAZOLE (FLAGYL) 500 MG tablet  2 times daily     01/24/19 1017         Clinical Impression: 1. STD exposure   2. Bacterial vaginosis     Disposition: Discharge  Condition: Good  I have discussed the results, Dx and Tx plan with the pt(& family if present). He/she/they expressed understanding and agree(s) with the plan. Discharge instructions discussed at great length. Strict return precautions discussed and pt &/or family have verbalized understanding of the instructions. No further questions at time of discharge.    New Prescriptions   METRONIDAZOLE (FLAGYL) 500 MG TABLET    Take 1 tablet (500 mg total) by mouth 2 (two) times daily for 7 days.    [facility] [address] [place]  [place] [id number] [phone] Schedule an appointment as soon as possible for a visit    [facility] [address] [id number] HQ [id number] [address] [place] [phone]       Winola Drum, Canary Brimhristopher J, MD 01/24/19 1024

## 2019-01-24 NOTE — Discharge Instructions (Signed)
Your wet prep today showed evidence of bacterial vaginosis, you will be treated with antibiotics for the next week for this.  Your exam was not consistent with serious infection such as PID however given the concern for exposure to sexually transmitted infection likely, we empirically treated you with antibiotics.  Please follow-up with your PCP for further management.  If any symptoms change or worsen, please return to the nearest emergency department.

## 2019-01-25 LAB — GC/CHLAMYDIA PROBE AMP (~~LOC~~) NOT AT ARMC
Chlamydia: NEGATIVE
Neisseria Gonorrhea: NEGATIVE

## 2019-06-14 ENCOUNTER — Other Ambulatory Visit: Payer: Self-pay

## 2019-06-14 ENCOUNTER — Emergency Department (HOSPITAL_BASED_OUTPATIENT_CLINIC_OR_DEPARTMENT_OTHER)
Admission: EM | Admit: 2019-06-14 | Discharge: 2019-06-14 | Disposition: A | Discharge status: Home / Self Care | Payer: Medicaid Other | Attending: Emergency Medicine | Admitting: Emergency Medicine

## 2019-06-14 ENCOUNTER — Encounter (HOSPITAL_BASED_OUTPATIENT_CLINIC_OR_DEPARTMENT_OTHER): Payer: Self-pay

## 2019-06-14 DIAGNOSIS — R1032 Left lower quadrant pain: Secondary | ICD-10-CM | POA: Insufficient documentation

## 2019-06-14 DIAGNOSIS — Z5321 Procedure and treatment not carried out due to patient leaving prior to being seen by health care provider: Secondary | ICD-10-CM | POA: Insufficient documentation

## 2019-06-14 LAB — URINALYSIS, ROUTINE W REFLEX MICROSCOPIC
Bilirubin Urine: NEGATIVE
Glucose, UA: NEGATIVE mg/dL
Ketones, ur: NEGATIVE mg/dL
Leukocytes,Ua: NEGATIVE
Nitrite: NEGATIVE
Protein, ur: NEGATIVE mg/dL
Specific Gravity, Urine: 1.025 (ref 1.005–1.030)
pH: 6 (ref 5.0–8.0)

## 2019-06-14 LAB — URINALYSIS, MICROSCOPIC (REFLEX)

## 2019-06-14 LAB — PREGNANCY, URINE: Preg Test, Ur: NEGATIVE

## 2019-06-14 NOTE — ED Triage Notes (Signed)
Pt c/o LLQ pain x 4 days-denies n/v/, urinary sx and vaginal dc/-NAD-steady gait

## 2019-06-15 ENCOUNTER — Encounter (HOSPITAL_BASED_OUTPATIENT_CLINIC_OR_DEPARTMENT_OTHER): Payer: Self-pay | Admitting: *Deleted

## 2019-06-15 ENCOUNTER — Inpatient Hospital Stay (HOSPITAL_BASED_OUTPATIENT_CLINIC_OR_DEPARTMENT_OTHER)
Admission: EM | Admit: 2019-06-15 | Discharge: 2019-06-17 | DRG: 759 | Disposition: A | Discharge status: Home / Self Care | Payer: Medicaid Other | Attending: Obstetrics and Gynecology | Admitting: Obstetrics and Gynecology

## 2019-06-15 ENCOUNTER — Emergency Department (HOSPITAL_BASED_OUTPATIENT_CLINIC_OR_DEPARTMENT_OTHER): Payer: Medicaid Other

## 2019-06-15 ENCOUNTER — Other Ambulatory Visit: Payer: Self-pay | Admitting: Obstetrics and Gynecology

## 2019-06-15 ENCOUNTER — Other Ambulatory Visit: Payer: Self-pay

## 2019-06-15 DIAGNOSIS — Z79899 Other long term (current) drug therapy: Secondary | ICD-10-CM

## 2019-06-15 DIAGNOSIS — Z1159 Encounter for screening for other viral diseases: Secondary | ICD-10-CM

## 2019-06-15 DIAGNOSIS — J302 Other seasonal allergic rhinitis: Secondary | ICD-10-CM | POA: Diagnosis present

## 2019-06-15 DIAGNOSIS — N7093 Salpingitis and oophoritis, unspecified: Secondary | ICD-10-CM | POA: Diagnosis present

## 2019-06-15 DIAGNOSIS — N730 Acute parametritis and pelvic cellulitis: Principal | ICD-10-CM | POA: Diagnosis present

## 2019-06-15 LAB — CBC WITH DIFFERENTIAL/PLATELET
Abs Immature Granulocytes: 0.09 10*3/uL — ABNORMAL HIGH (ref 0.00–0.07)
Basophils Absolute: 0 10*3/uL (ref 0.0–0.1)
Basophils Relative: 0 %
Eosinophils Absolute: 0.1 10*3/uL (ref 0.0–0.5)
Eosinophils Relative: 1 %
HCT: 39.9 % (ref 36.0–46.0)
Hemoglobin: 13.1 g/dL (ref 12.0–15.0)
Immature Granulocytes: 1 %
Lymphocytes Relative: 10 %
Lymphs Abs: 2 10*3/uL (ref 0.7–4.0)
MCH: 30.2 pg (ref 26.0–34.0)
MCHC: 32.8 g/dL (ref 30.0–36.0)
MCV: 91.9 fL (ref 80.0–100.0)
Monocytes Absolute: 2 10*3/uL — ABNORMAL HIGH (ref 0.1–1.0)
Monocytes Relative: 10 %
Neutro Abs: 15.7 10*3/uL — ABNORMAL HIGH (ref 1.7–7.7)
Neutrophils Relative %: 78 %
Platelets: 298 10*3/uL (ref 150–400)
RBC: 4.34 MIL/uL (ref 3.87–5.11)
RDW: 13.1 % (ref 11.5–15.5)
WBC: 19.9 10*3/uL — ABNORMAL HIGH (ref 4.0–10.5)
nRBC: 0 % (ref 0.0–0.2)

## 2019-06-15 LAB — COMPREHENSIVE METABOLIC PANEL
ALT: 9 U/L (ref 0–44)
AST: 15 U/L (ref 15–41)
Albumin: 4.1 g/dL (ref 3.5–5.0)
Alkaline Phosphatase: 63 U/L (ref 38–126)
Anion gap: 11 (ref 5–15)
BUN: 7 mg/dL (ref 6–20)
CO2: 25 mmol/L (ref 22–32)
Calcium: 8.9 mg/dL (ref 8.9–10.3)
Chloride: 101 mmol/L (ref 98–111)
Creatinine, Ser: 0.67 mg/dL (ref 0.44–1.00)
GFR calc Af Amer: >60 mL/min (ref >60)
GFR calc non Af Amer: >60 mL/min (ref >60)
Glucose, Bld: 115 mg/dL — ABNORMAL HIGH (ref 70–99)
Potassium: 3.6 mmol/L (ref 3.5–5.1)
Sodium: 137 mmol/L (ref 135–145)
Total Bilirubin: 1 mg/dL (ref 0.3–1.2)
Total Protein: 7.8 g/dL (ref 6.5–8.1)

## 2019-06-15 LAB — WET PREP, GENITAL
Sperm: NONE SEEN
Trich, Wet Prep: NONE SEEN
Yeast Wet Prep HPF POC: NONE SEEN

## 2019-06-15 LAB — SARS CORONAVIRUS 2 AG (30 MIN TAT): SARS Coronavirus 2 Ag: NEGATIVE

## 2019-06-15 MED ORDER — MORPHINE SULFATE (PF) 2 MG/ML IV SOLN
2.0000 mg | Freq: Once | INTRAVENOUS | Status: AC
  Administered 2019-06-15: 10:00:00 2 mg via INTRAVENOUS
  Filled 2019-06-15: qty 1

## 2019-06-15 MED ORDER — POLYETHYLENE GLYCOL 3350 17 G PO PACK
17.0000 g | PACK | Freq: Every day | ORAL | Status: DC
  Administered 2019-06-15: 17 g via ORAL
  Filled 2019-06-15: qty 1

## 2019-06-15 MED ORDER — ONDANSETRON HCL 4 MG/2ML IJ SOLN
4.0000 mg | Freq: Once | INTRAMUSCULAR | Status: DC
  Filled 2019-06-15: qty 2

## 2019-06-15 MED ORDER — SODIUM CHLORIDE 0.9 % IV SOLN
2.0000 g | Freq: Once | INTRAVENOUS | Status: AC
  Administered 2019-06-15: 2 g via INTRAVENOUS
  Filled 2019-06-15: qty 2

## 2019-06-15 MED ORDER — OXYCODONE HCL 5 MG PO TABS: 5.0000 mg | ORAL_TABLET | Freq: Four times a day (QID) | ORAL | Status: DC | PRN

## 2019-06-15 MED ORDER — IBUPROFEN 600 MG PO TABS
600.0000 mg | ORAL_TABLET | Freq: Four times a day (QID) | ORAL | Status: AC
  Administered 2019-06-15 – 2019-06-17 (×8): 600 mg via ORAL
  Filled 2019-06-15 (×8): qty 1

## 2019-06-15 MED ORDER — SODIUM CHLORIDE 0.9 % IV BOLUS
1000.0000 mL | Freq: Once | INTRAVENOUS | Status: AC
  Administered 2019-06-15: 1000 mL via INTRAVENOUS

## 2019-06-15 MED ORDER — ACETAMINOPHEN 325 MG PO TABS: 650.0000 mg | ORAL_TABLET | ORAL | Status: DC | PRN

## 2019-06-15 MED ORDER — METRONIDAZOLE 500 MG PO TABS
500.0000 mg | ORAL_TABLET | Freq: Two times a day (BID) | ORAL | Status: DC
  Administered 2019-06-15: 500 mg via ORAL
  Filled 2019-06-15: qty 1

## 2019-06-15 MED ORDER — MORPHINE SULFATE (PF) 4 MG/ML IV SOLN
4.0000 mg | Freq: Once | INTRAVENOUS | Status: AC
  Administered 2019-06-15: 10:00:00 4 mg via INTRAVENOUS
  Filled 2019-06-15: qty 1

## 2019-06-15 MED ORDER — DOXYCYCLINE HYCLATE 100 MG PO TABS
100.0000 mg | ORAL_TABLET | Freq: Once | ORAL | Status: AC
  Administered 2019-06-15: 100 mg via ORAL
  Filled 2019-06-15: qty 1

## 2019-06-15 MED ORDER — SODIUM CHLORIDE 0.9 % IV SOLN
2.0000 g | Freq: Four times a day (QID) | INTRAVENOUS | Status: DC
  Administered 2019-06-15 – 2019-06-16 (×3): 2 g via INTRAVENOUS
  Filled 2019-06-15 (×5): qty 2

## 2019-06-15 MED ORDER — DOXYCYCLINE HYCLATE 100 MG PO TABS
100.0000 mg | ORAL_TABLET | Freq: Two times a day (BID) | ORAL | Status: DC
  Administered 2019-06-15: 100 mg via ORAL
  Filled 2019-06-15: qty 1

## 2019-06-15 MED ORDER — ONDANSETRON HCL 4 MG/2ML IJ SOLN
4.0000 mg | Freq: Four times a day (QID) | INTRAMUSCULAR | Status: DC | PRN
  Administered 2019-06-15: 4 mg via INTRAVENOUS
  Filled 2019-06-15: qty 2

## 2019-06-15 MED ORDER — LACTATED RINGERS IV SOLN
INTRAVENOUS | Status: AC
  Administered 2019-06-15: 15:00:00 via INTRAVENOUS

## 2019-06-15 MED ORDER — SODIUM CHLORIDE 0.9 % IV SOLN
INTRAVENOUS | Status: DC | PRN
  Administered 2019-06-16: 250 mL via INTRAVENOUS

## 2019-06-15 MED ORDER — ONDANSETRON HCL 4 MG PO TABS
4.0000 mg | ORAL_TABLET | Freq: Four times a day (QID) | ORAL | Status: DC | PRN
  Administered 2019-06-16 (×2): 4 mg via ORAL
  Filled 2019-06-15 (×2): qty 1

## 2019-06-15 MED ORDER — SODIUM CHLORIDE 0.9 % IV SOLN
1.0000 g | Freq: Once | INTRAVENOUS | Status: AC
  Administered 2019-06-15: 1 g via INTRAVENOUS
  Filled 2019-06-15: qty 10

## 2019-06-15 MED ORDER — METRONIDAZOLE IN NACL 5-0.79 MG/ML-% IV SOLN
500.0000 mg | Freq: Once | INTRAVENOUS | Status: AC
  Administered 2019-06-15: 500 mg via INTRAVENOUS
  Filled 2019-06-15: qty 100

## 2019-06-15 NOTE — ED Notes (Signed)
rn at 6 n unable to take report at present

## 2019-06-15 NOTE — ED Notes (Signed)
Pt requesting food and drink. Informed we are waiting on [place] results.

## 2019-06-15 NOTE — ED Notes (Signed)
Pt rates pain 7/10 but is on phone and watching tv

## 2019-06-15 NOTE — Progress Notes (Signed)
Patient called this RN to inform that she was sick in her stomach after taking PO antibiotics doxycycline and flagyl tablets and vomited a little.  Pat. Took the PO antibiotics at 2146 and reported the incident an hour later.  Patient requested to have her antibiotics in IV form since that was her problem at home.  However, pt. Tolerated well PO Advil, will pass on to day shift RN accordingly.

## 2019-06-15 NOTE — ED Provider Notes (Signed)
[facility] EMERGENCY DEPARTMENT Provider Note   CSN: [id number] Arrival date & time: 06/15/19  [id number]    History   Chief Complaint Chief Complaint  Patient presents with   Abdominal Pain    HPI Michele Mcdonald is a 23 y.o. female.     The history is provided by the patient and medical records. No language interpreter was used.  Abdominal Pain  Michele SamKadeshia Schreck is a 23 y.o. female who presents to the Emergency Department complaining of abdomina pain. She presents to the emergency department complaining of abdominal pain that began about four days ago. Pain is described as sharp and stabbing in nature and located in the left lower quadrant. Pain is constant in nature. She denies any fevers, nausea, vomiting, dysuria, vaginal discharge. She does have slight loose stools, to bowel movements daily. She presented to the ED yesterday for evaluation but left prior to being evaluated. She does report similar symptoms in April and was treated for an infection in her fallopian tube. She did not complete the antibiotic course at that time due to intolerance. She did not have follow-up after her ED visit. LMP was two days ago. She denies any additional medical problems. She is sexually active with a single partner, does not use protection. Past Medical History:  Diagnosis Date   Seasonal allergies     Patient Active Problem List   Diagnosis Date Noted   PID (acute pelvic inflammatory disease) 06/15/2019    History reviewed. No pertinent surgical history.   OB History   No obstetric history on file.      Home Medications    Prior to Admission medications   Medication Sig Start Date End Date Taking? Authorizing Provider  acetaminophen (TYLENOL) 500 MG tablet Take 1 tablet (500 mg total) by mouth every 6 (six) hours as needed. 01/09/19   Law, Waylan BogaAlexandra M, PA-C  amoxicillin (AMOXIL) 500 MG capsule Take 1 capsule (500 mg total) by mouth 3 (three) times daily. 06/16/18   Molpus,  John, MD  cetirizine-pseudoephedrine (ZYRTEC-D) 5-120 MG tablet Take 1 tablet by mouth daily. 02/05/18   Law, Waylan BogaAlexandra M, PA-C  ibuprofen (ADVIL,MOTRIN) 600 MG tablet Take 1 tablet (600 mg total) by mouth every 6 (six) hours as needed. 01/09/19   Law, Waylan BogaAlexandra M, PA-C  ondansetron (ZOFRAN) 4 MG tablet Take 1 tablet (4 mg total) by mouth every 8 (eight) hours as needed for nausea or vomiting. 06/11/17   Arthor CaptainHarris, Abigail, PA-C    Family History No family history on file.  Social History Social History   Tobacco Use   Smoking status: Never Smoker   Smokeless tobacco: Never Used  Substance Use Topics   Alcohol use: No   Drug use: No     Allergies   Patient has no known allergies.   Review of Systems Review of Systems  Gastrointestinal: Positive for abdominal pain.  All other systems reviewed and are negative.    Physical Exam Updated Vital Signs BP 123/66 (BP Location: Left Arm)    Pulse 76    Temp 98.5 F (36.9 C)    Resp 16    Ht 5\' 4"  (1.626 m)    Wt 68 kg    LMP 06/10/2019    SpO2 100%    BMI 25.75 kg/m   Physical Exam Vitals signs and nursing note reviewed.  Constitutional:      Appearance: She is well-developed.  HENT:     Head: Normocephalic and atraumatic.  Cardiovascular:  Rate and Rhythm: Regular rhythm.     Comments: Tachycardic Pulmonary:     Effort: Pulmonary effort is normal. No respiratory distress.  Abdominal:     Palpations: Abdomen is soft.     Tenderness: There is no guarding or rebound.     Comments: Moderate left lower quadrant and suprapubic tenderness without guarding or rebound  Musculoskeletal:        General: No tenderness.  Skin:    General: Skin is warm and dry.  Neurological:     Mental Status: She is alert and oriented to person, place, and time.  Psychiatric:        Behavior: Behavior normal.      ED Treatments / Results  Labs (all labs ordered are listed, but only abnormal results are displayed) Labs Reviewed  WET  PREP, GENITAL - Abnormal; Notable for the following components:      Result Value   Clue Cells Wet Prep HPF POC PRESENT (*)    WBC, Wet Prep HPF POC MANY (*)    All other components within normal limits  COMPREHENSIVE METABOLIC PANEL - Abnormal; Notable for the following components:   Glucose, Bld 115 (*)    All other components within normal limits  CBC WITH DIFFERENTIAL/PLATELET - Abnormal; Notable for the following components:   WBC 19.9 (*)    Neutro Abs 15.7 (*)    Monocytes Absolute 2.0 (*)    Abs Immature Granulocytes 0.09 (*)    All other components within normal limits  SARS CORONAVIRUS 2 (HOSP ORDER, PERFORMED IN [place] [facility] VIA ABBOTT ID)  URINE CULTURE  RPR  HIV ANTIBODY (ROUTINE TESTING W REFLEX)  GC/CHLAMYDIA PROBE AMP ([facility]) NOT [facility]    EKG None  Radiology [place] Transvaginal Non-ob  Result Date: 06/15/2019 CLINICAL DATA:  Left lower quadrant pain. EXAM: TRANSABDOMINAL AND TRANSVAGINAL ULTRASOUND OF PELVIS DOPPLER ULTRASOUND OF OVARIES TECHNIQUE: Both transabdominal and transvaginal ultrasound examinations of the pelvis were performed. Transabdominal technique was performed for global imaging of the pelvis including uterus, ovaries, adnexal regions, and pelvic cul-de-sac. It was necessary to proceed with endovaginal exam following the transabdominal exam to visualize the uterus and ovaries. Color and duplex Doppler ultrasound was utilized to evaluate blood flow to the ovaries. COMPARISON:  03/31/2019. FINDINGS: Uterus Measurements: 7.8 x 3.5 x 3.9 cm = volume: 54.4 mL. No fibroids or other mass visualized. Endometrium Thickness: 3.0.  No focal abnormality visualized. Right ovary Measurements: 2.4 x 1.6 x 3.4 cm = volume: 6.8 mL. No ovarian mass. Left ovary Measurements: 2.9 x 1.9 x 2.2 cm = volume: 6.5 mL.  No ovarian mass. Fluid filled tubular structures are again noted and both adnexal regions. These are most likely dilated fallopian tubes. Structure on the right  measures 6.5 x 2.7 x 5.4 cm. Structure on the left measures 5.4 x 4.9 x 4.2 cm Pulsed Doppler evaluation of both ovaries demonstrates normal low-resistance arterial and venous waveforms. Other findings Small amount of free pelvic fluid. IMPRESSION: 1. Bilateral complex adnexal structures most likely dilated fallopian tubes. Findings again consistent with hydro or pyosalpinx. Similar findings noted on prior exam. 2.  Small amount of free pelvic fluid. Electronically Signed   By: Maisie Fushomas  Register   On: 06/15/2019 11:42   [place] Pelvis Complete  Result Date: 06/15/2019 CLINICAL DATA:  Left lower quadrant pain. EXAM: TRANSABDOMINAL AND TRANSVAGINAL ULTRASOUND OF PELVIS DOPPLER ULTRASOUND OF OVARIES TECHNIQUE: Both transabdominal and transvaginal ultrasound examinations of the pelvis were performed. Transabdominal technique was  performed for global imaging of the pelvis including uterus, ovaries, adnexal regions, and pelvic cul-de-sac. It was necessary to proceed with endovaginal exam following the transabdominal exam to visualize the uterus and ovaries. Color and duplex Doppler ultrasound was utilized to evaluate blood flow to the ovaries. COMPARISON:  03/31/2019. FINDINGS: Uterus Measurements: 7.8 x 3.5 x 3.9 cm = volume: 54.4 mL. No fibroids or other mass visualized. Endometrium Thickness: 3.0.  No focal abnormality visualized. Right ovary Measurements: 2.4 x 1.6 x 3.4 cm = volume: 6.8 mL. No ovarian mass. Left ovary Measurements: 2.9 x 1.9 x 2.2 cm = volume: 6.5 mL.  No ovarian mass. Fluid filled tubular structures are again noted and both adnexal regions. These are most likely dilated fallopian tubes. Structure on the right measures 6.5 x 2.7 x 5.4 cm. Structure on the left measures 5.4 x 4.9 x 4.2 cm Pulsed Doppler evaluation of both ovaries demonstrates normal low-resistance arterial and venous waveforms. Other findings Small amount of free pelvic fluid. IMPRESSION: 1. Bilateral complex adnexal structures most  likely dilated fallopian tubes. Findings again consistent with hydro or pyosalpinx. Similar findings noted on prior exam. 2.  Small amount of free pelvic fluid. Electronically Signed   By: Maisie Fushomas  Register   On: 06/15/2019 11:42   [place] Art/ven Flow Abd Pelv Doppler  Result Date: 06/15/2019 CLINICAL DATA:  Left lower quadrant pain. EXAM: TRANSABDOMINAL AND TRANSVAGINAL ULTRASOUND OF PELVIS DOPPLER ULTRASOUND OF OVARIES TECHNIQUE: Both transabdominal and transvaginal ultrasound examinations of the pelvis were performed. Transabdominal technique was performed for global imaging of the pelvis including uterus, ovaries, adnexal regions, and pelvic cul-de-sac. It was necessary to proceed with endovaginal exam following the transabdominal exam to visualize the uterus and ovaries. Color and duplex Doppler ultrasound was utilized to evaluate blood flow to the ovaries. COMPARISON:  03/31/2019. FINDINGS: Uterus Measurements: 7.8 x 3.5 x 3.9 cm = volume: 54.4 mL. No fibroids or other mass visualized. Endometrium Thickness: 3.0.  No focal abnormality visualized. Right ovary Measurements: 2.4 x 1.6 x 3.4 cm = volume: 6.8 mL. No ovarian mass. Left ovary Measurements: 2.9 x 1.9 x 2.2 cm = volume: 6.5 mL.  No ovarian mass. Fluid filled tubular structures are again noted and both adnexal regions. These are most likely dilated fallopian tubes. Structure on the right measures 6.5 x 2.7 x 5.4 cm. Structure on the left measures 5.4 x 4.9 x 4.2 cm Pulsed Doppler evaluation of both ovaries demonstrates normal low-resistance arterial and venous waveforms. Other findings Small amount of free pelvic fluid. IMPRESSION: 1. Bilateral complex adnexal structures most likely dilated fallopian tubes. Findings again consistent with hydro or pyosalpinx. Similar findings noted on prior exam. 2.  Small amount of free pelvic fluid. Electronically Signed   By: Maisie Fushomas  Register   On: 06/15/2019 11:42    Procedures Procedures (including critical care  time)  Medications Ordered in ED Medications  ondansetron (ZOFRAN) injection 4 mg (has no administration in time range)  morphine 2 MG/ML injection 2 mg (2 mg Intravenous Given 06/15/19 0933)  cefTRIAXone (ROCEPHIN) 1 g in sodium chloride 0.9 % 100 mL IVPB (0 g Intravenous Stopped 06/15/19 1012)  sodium chloride 0.9 % bolus 1,000 mL (0 mLs Intravenous Stopped 06/15/19 1022)  morphine 4 MG/ML injection 4 mg (4 mg Intravenous Given 06/15/19 1018)  doxycycline (VIBRA-TABS) tablet 100 mg (100 mg Oral Given 06/15/19 1018)  metroNIDAZOLE (FLAGYL) IVPB 500 mg (0 mg Intravenous Stopped 06/15/19 1347)  cefOXitin (MEFOXIN) 2 g in sodium chloride 0.9 % 100  mL IVPB (2 g Intravenous New Bag/Given 06/15/19 1353)     Initial Impression / Assessment and Plan / ED Course  I have reviewed the triage vital signs and the nursing notes.  Pertinent labs & imaging results that were available during my care of the patient were reviewed by me and considered in my medical decision making (see chart for details).        Patient with history of pelvic infection here for evaluation of recurrent left lower quadrant pain. She is non-toxic appearing on examination but does have lower abdominal tenderness. Pelvic examination with significant discharge concerning for PID. Pelvic ultrasound demonstrates bilateral pyosalpinx. CBC with significant leukocytosis. Discussed the case with Dr. Ilda Basset with OB/GYN, recommends transfer to [facility] for admission for IV antibiotics. Patient updated of findings of studies and recommendation for admission and she is in agreement with treatment plan.  Final Clinical Impressions(s) / ED Diagnoses   Final diagnoses:  PID (acute pelvic inflammatory disease)    ED Discharge Orders    None       Quintella Reichert, MD 06/15/19 1442

## 2019-06-15 NOTE — H&P (Signed)
Obstetrics & Gynecology H&P   Date of Admission: 06/15/2019   Primary OBGYN: None Primary Care Provider: System, Provider Not In  Reason for Admission: bilateral TOA, PID  History of Present Illness: Michele Mcdonald is a 23 y.o. G0 (Patient's last menstrual period was 06/10/2019.), with the above CC. PMHx is significant for h/o STIs and TOA.  Worsening abdominal pain for past few days and presented to [facility]. Concern for bilateral TOAs [facility].     ROS: A 12-point review of systems was performed and negative, except as stated in the above HPI.  OBGYN History: As per HPI. OB History  Gravida Para Term Preterm AB Living  0 0 0 0 0 0  SAB TAB Ectopic Multiple Live Births  0 0 0 0 0    Periods: qmonth, regular, 4-5 days History of pap smears: Unsure History of STIs: Yes She is currently using nothing for contraception.    Past Medical History: Past Medical History:  Diagnosis Date  . Seasonal allergies     Past Surgical History: History reviewed. No pertinent surgical history.  Family History:  No family history on file.   Social History:  Social History   Socioeconomic History  . Marital status: Single    Spouse name: Not on file  . Number of children: Not on file  . Years of education: Not on file  . Highest education level: Not on file  Occupational History  . Not on file  Social Needs  . Financial resource strain: Not on file  . Food insecurity    Worry: Not on file    Inability: Not on file  . Transportation needs    Medical: Not on file    Non-medical: Not on file  Tobacco Use  . Smoking status: Never Smoker  . Smokeless tobacco: Never Used  Substance and Sexual Activity  . Alcohol use: No  . Drug use: No  . Sexual activity: Yes    Birth control/protection: None  Lifestyle  . Physical activity    Days per week: Not on file    Minutes per session: Not on file  . Stress: Not on file  Relationships  . Social  Musicianconnections    Talks on phone: Not on file    Gets together: Not on file    Attends religious service: Not on file    Active member of club or organization: Not on file    Attends meetings of clubs or organizations: Not on file    Relationship status: Not on file  . Intimate partner violence    Fear of current or ex partner: Not on file    Emotionally abused: Not on file    Physically abused: Not on file    Forced sexual activity: Not on file  Other Topics Concern  . Not on file  Social History Narrative  . Not on file    Allergy: No Known Allergies  Current Outpatient Medications: Medications Prior to Admission  Medication Sig Dispense Refill Last Dose  . acetaminophen (TYLENOL) 500 MG tablet Take 1 tablet (500 mg total) by mouth every 6 (six) hours as needed. 30 tablet 0   . cetirizine-pseudoephedrine (ZYRTEC-D) 5-120 MG tablet Take 1 tablet by mouth daily. 30 tablet 0   . ibuprofen (ADVIL,MOTRIN) 600 MG tablet Take 1 tablet (600 mg total) by mouth every 6 (six) hours as needed. 30 tablet 0   . ondansetron (ZOFRAN) 4 MG tablet Take 1 tablet (4 mg  total) by mouth every 8 (eight) hours as needed for nausea or vomiting. 10 tablet 0      Hospital Medications: Current Facility-Administered Medications  Medication Dose Route Frequency Provider Last Rate Last Dose  . acetaminophen (TYLENOL) tablet 650 mg  650 mg Oral Q4H PRN Aletha Halim, MD      . cefOXitin (MEFOXIN) 2 g in sodium chloride 0.9 % 100 mL IVPB  2 g Intravenous Q6H Ranyah Groeneveld, Eduard Clos, MD      . doxycycline (VIBRA-TABS) tablet 100 mg  100 mg Oral Q12H Aletha Halim, MD      . ibuprofen (ADVIL) tablet 600 mg  600 mg Oral Q6H Aletha Halim, MD   600 mg at 06/15/19 1508  . lactated ringers infusion   Intravenous Continuous Aletha Halim, MD 100 mL/hr at 06/15/19 1510    . metroNIDAZOLE (FLAGYL) tablet 500 mg  500 mg Oral Q12H Aletha Halim, MD      . ondansetron [facility]) injection 4 mg  4 mg Intravenous Once  Quintella Reichert, MD      . ondansetron [facility]) tablet 4 mg  4 mg Oral Q6H PRN Aletha Halim, MD       Or  . ondansetron (ZOFRAN) injection 4 mg  4 mg Intravenous Q6H PRN Aletha Halim, MD      . oxyCODONE (Oxy IR/ROXICODONE) immediate release tablet 5-10 mg  5-10 mg Oral Q6H PRN Aletha Halim, MD      . polyethylene glycol (MIRALAX / GLYCOLAX) packet 17 g  17 g Oral Daily Aletha Halim, MD   17 g at 06/15/19 1508     Physical Exam:  Current Vital Signs 24h Vital Sign Ranges  T 98.4 F (36.9 C) Temp  Avg: 98.5 F (36.9 C)  Min: 98.2 F (36.8 C)  Max: 99 F (37.2 C)  BP 103/69 BP  Min: 103/69  Max: 127/108  HR 65 Pulse  Avg: 82.8  Min: 65  Max: 106  RR 16 Resp  Avg: 16.7  Min: 14  Max: 20  SaO2 100 % Room Air SpO2  Avg: 100 %  Min: 100 %  Max: 100 %       24 Hour I/O Current Shift I/O  Time Ins Outs No intake/output data recorded. 06/19 0701 - 06/19 1900 In: 240 [P.O.:240] Out: -     Body mass index is 25.75 kg/m. General appearance: Well nourished, well developed female in no acute distress.  Cardiovascular: S1, S2 normal, no murmur, rub or gallop, regular rate and rhythm Respiratory:  Clear to auscultation bilateral. Normal respiratory effort Abdomen: positive bowel sounds and no masses, hernias; diffusely non tender to palpation, non distended Neuro/Psych:  Normal mood and affect.  Skin:  Warm and dry.  Extremities: no clubbing, cyanosis, or edema.  Lymphatic:  No inguinal lymphadenopathy.   Laboratory: Urine pregnancy: negative COVID test: negative Pending: GC/CT, urine culture, hiv, rpr Wet prep: +clue cells, wbc  Recent Labs  Lab 06/15/19 0852  WBC 19.9*  HGB 13.1  HCT 39.9  PLT 298   Recent Labs  Lab 06/15/19 0852  NA 137  K 3.6  CL 101  CO2 25  BUN 7  CREATININE 0.67  CALCIUM 8.9  PROT 7.8  BILITOT 1.0  ALKPHOS 63  ALT 9  AST 15  GLUCOSE 115*   Imaging:  CLINICAL DATA:  Left lower quadrant pain.  EXAM: TRANSABDOMINAL AND  TRANSVAGINAL ULTRASOUND OF PELVIS  DOPPLER ULTRASOUND OF OVARIES  TECHNIQUE: Both transabdominal and transvaginal ultrasound examinations of  the pelvis were performed. Transabdominal technique was performed for global imaging of the pelvis including uterus, ovaries, adnexal regions, and pelvic cul-de-sac.  It was necessary to proceed with endovaginal exam following the transabdominal exam to visualize the uterus and ovaries. Color and duplex Doppler ultrasound was utilized to evaluate blood flow to the ovaries.  COMPARISON:  03/31/2019.  FINDINGS: Uterus  Measurements: 7.8 x 3.5 x 3.9 cm = volume: 54.4 mL. No fibroids or other mass visualized.  Endometrium  Thickness: 3.0.  No focal abnormality visualized.  Right ovary  Measurements: 2.4 x 1.6 x 3.4 cm = volume: 6.8 mL. No ovarian mass.  Left ovary  Measurements: 2.9 x 1.9 x 2.2 cm = volume: 6.5 mL.  No ovarian mass.  Fluid filled tubular structures are again noted and both adnexal regions. These are most likely dilated fallopian tubes. Structure on the right measures 6.5 x 2.7 x 5.4 cm. Structure on the left measures 5.4 x 4.9 x 4.2 cm  Pulsed Doppler evaluation of both ovaries demonstrates normal low-resistance arterial and venous waveforms.  Other findings  Small amount of free pelvic fluid.  IMPRESSION: 1. Bilateral complex adnexal structures most likely dilated fallopian tubes. Findings again consistent with hydro or pyosalpinx. Similar findings noted on prior exam.  2.  Small amount of free pelvic fluid.   Electronically Signed   By: Maisie Fushomas  Register   On: 06/15/2019 11:42  Assessment: Michele Mcdonald is a 23 y.o. P0 (Patient's last menstrual period was 06/10/2019.) with TOA/PID  Plan: Admit to GYN Continue IV abx for a few days, then transition to PO I don't anticipate needing surgical intervetion. If so, can try IR first F/u pending labs  Total time taking care of the patient  was 20 minutes, with greater than 50% of the time spent in face to face interaction with the patient.  Cornelia Copaharlie Vester Titsworth, Jr MD Attending Center for [facility] GYN Consult Phone: [phone] (M-F, 0800-1700) & [phone] (Off hours, weekends, holidays)

## 2019-06-15 NOTE — ED Triage Notes (Addendum)
/  o left lower abd pain x 5 days,  Denies n/v, de vag dc,  Denies ua sx

## 2019-06-16 LAB — CBC WITH DIFFERENTIAL/PLATELET
Abs Immature Granulocytes: 0.08 10*3/uL — ABNORMAL HIGH (ref 0.00–0.07)
Basophils Absolute: 0 10*3/uL (ref 0.0–0.1)
Basophils Relative: 0 %
Eosinophils Absolute: 0.1 10*3/uL (ref 0.0–0.5)
Eosinophils Relative: 1 %
HCT: 32.1 % — ABNORMAL LOW (ref 36.0–46.0)
Hemoglobin: 10.5 g/dL — ABNORMAL LOW (ref 12.0–15.0)
Immature Granulocytes: 1 %
Lymphocytes Relative: 21 %
Lymphs Abs: 3.3 10*3/uL (ref 0.7–4.0)
MCH: 30 pg (ref 26.0–34.0)
MCHC: 32.7 g/dL (ref 30.0–36.0)
MCV: 91.7 fL (ref 80.0–100.0)
Monocytes Absolute: 1.5 10*3/uL — ABNORMAL HIGH (ref 0.1–1.0)
Monocytes Relative: 10 %
Neutro Abs: 10.7 10*3/uL — ABNORMAL HIGH (ref 1.7–7.7)
Neutrophils Relative %: 67 %
Platelets: 243 10*3/uL (ref 150–400)
RBC: 3.5 MIL/uL — ABNORMAL LOW (ref 3.87–5.11)
RDW: 13 % (ref 11.5–15.5)
WBC: 15.7 10*3/uL — ABNORMAL HIGH (ref 4.0–10.5)
nRBC: 0 % (ref 0.0–0.2)

## 2019-06-16 LAB — PROTIME-INR
INR: 1.2 (ref 0.8–1.2)
Prothrombin Time: 15.2 seconds (ref 11.4–15.2)

## 2019-06-16 LAB — ABO/RH: ABO/RH(D): O POS

## 2019-06-16 LAB — RPR: RPR Ser Ql: NONREACTIVE

## 2019-06-16 LAB — TYPE AND SCREEN
ABO/RH(D): O POS
Antibody Screen: NEGATIVE

## 2019-06-16 LAB — HIV ANTIBODY (ROUTINE TESTING W REFLEX): HIV Screen 4th Generation wRfx: NONREACTIVE

## 2019-06-16 LAB — APTT: aPTT: 35 seconds (ref 24–36)

## 2019-06-16 MED ORDER — AMOXICILLIN-POT CLAVULANATE ER 1000-62.5 MG PO TB12
2.0000 | ORAL_TABLET | Freq: Two times a day (BID) | ORAL | Status: DC
  Administered 2019-06-16 – 2019-06-17 (×3): 2 via ORAL
  Filled 2019-06-16 (×3): qty 2

## 2019-06-16 MED ORDER — SODIUM CHLORIDE 0.9% FLUSH: 10.0000 mL | INTRAVENOUS | Status: DC | PRN

## 2019-06-16 NOTE — Progress Notes (Addendum)
Gynecology Progress Note  Admission Date: 06/15/2019 Current Date: 06/16/2019 8:32 AM  Michele Mcdonald is a 23 y.o. G0 (LMP: 6/14) HD#2 admitted for b/l TOAs.   History complicated by: Patient Active Problem List   Diagnosis Date Noted  . PID (acute pelvic inflammatory disease) 06/15/2019  . TOA (tubo-ovarian abscess) 06/15/2019    ROS and patient/family/surgical history, located on admission H&P note dated 06/15/2019, have been reviewed, and there are no changes except as noted below Yesterday/Overnight Events:  None  Subjective:  No systemic s/s and pain much improved and doing well with PO  Objective:    Current Vital Signs 24h Vital Sign Ranges  T 98.2 F (36.8 C) Temp  Avg: 98.4 F (36.9 C)  Min: 97.9 F (36.6 C)  Max: 99 F (37.2 C)  BP 105/67 BP  Min: 103/69  Max: 127/108  HR 62 Pulse  Avg: 76.2  Min: 62  Max: 106  RR 19 Resp  Avg: 17.6  Min: 14  Max: 20  SaO2 100 % Room Air SpO2  Avg: 100 %  Min: 100 %  Max: 100 %       24 Hour I/O Current Shift I/O  Time Ins Outs 06/19 0701 - 06/20 0700 In: 1709.3 [P.O.:720; I.V.:789.3] Out: -  No intake/output data recorded.    Patient Vitals for the past 24 hrs:  BP Temp Temp src Pulse Resp SpO2 Height Weight  06/16/19 0433 105/67 98.2 F (36.8 C) Oral 62 19 100 % - -  06/15/19 2055 112/76 97.9 F (36.6 C) Oral 64 19 100 % - -  06/15/19 1443 103/69 98.4 F (36.9 C) Oral 65 - 100 % - -  06/15/19 1344 123/66 98.5 F (36.9 C) - 76 16 100 % - -  06/15/19 1136 (!) 127/108 98.2 F (36.8 C) Oral 84 14 100 % - -  06/15/19 0836 124/75 99 F (37.2 C) Oral (!) 106 20 100 % 5\' 4"  (1.626 m) 68 kg    Physical exam: General appearance: alert, cooperative and appears stated age Abdomen: soft, minimally ttp, no peritoneal s/s Lungs: clear to auscultation bilaterally Heart: S1, S2 normal, no murmur, rub or gallop, regular rate and rhythm Extremities: no c/c/e Skin: warm and dry Psych: appropriate Neurologic: Grossly  normal  Medications Current Facility-Administered Medications  Medication Dose Route Frequency Provider Last Rate Last Dose  . 0.9 %  sodium chloride infusion   Intravenous PRN [place] BingPickens, Loreal Schuessler, MD 10 mL/hr at 06/16/19 0500    . acetaminophen (TYLENOL) tablet 650 mg  650 mg Oral Q4H PRN [place] BingPickens, Stevenson Windmiller, MD      . cefOXitin (MEFOXIN) 2 g in sodium chloride 0.9 % 100 mL IVPB  2 g Intravenous Q6H [place] BingPickens, Adelayde Minney, MD   Stopped at 06/16/19 0243  . doxycycline (VIBRA-TABS) tablet 100 mg  100 mg Oral Q12H [place] BingPickens, Cornellius Kropp, MD   100 mg at 06/15/19 2146  . ibuprofen (ADVIL) tablet 600 mg  600 mg Oral Q6H [place] BingPickens, Tajai Ihde, MD   600 mg at 06/16/19 0208  . metroNIDAZOLE (FLAGYL) tablet 500 mg  500 mg Oral Q12H [place] BingPickens, Ersel Enslin, MD   500 mg at 06/15/19 2146  . ondansetron (ZOFRAN) injection 4 mg  4 mg Intravenous Once [place] Fossaees, Elizabeth, MD      . ondansetron [facility](ZOFRAN) tablet 4 mg  4 mg Oral Q6H PRN [place] BingPickens, Tishawn Friedhoff, MD       Or  . ondansetron (ZOFRAN) injection 4 mg  4 mg Intravenous Q6H PRN  BingPickens, Kinza Gouveia, MD  4 mg at 06/15/19 2258  . oxyCODONE (Oxy IR/ROXICODONE) immediate release tablet 5-10 mg  5-10 mg Oral Q6H PRN Aletha Halim, MD      . polyethylene glycol (MIRALAX / GLYCOLAX) packet 17 g  17 g Oral Daily Aletha Halim, MD   17 g at 06/15/19 1508  . sodium chloride flush (NS) 0.9 % injection 10-40 mL  10-40 mL Intracatheter PRN Aletha Halim, MD          Labs  Recent Labs  Lab 06/15/19 0852 06/16/19 0407  WBC 19.9* 15.7*  HGB 13.1 10.5*  HCT 39.9 32.1*  PLT 298 243    Recent Labs  Lab 06/15/19 0852  NA 137  K 3.6  CL 101  CO2 25  BUN 7  CREATININE 0.67  CALCIUM 8.9  PROT 7.8  BILITOT 1.0  ALKPHOS 63  ALT 9  AST 15  GLUCOSE 767*   Conflict (See Lab Report): O POS/O POS Performed at [facility], [place] [address] [place], [place] [address]  Negative: HIV, RPR, PT/PTT/INR  Pending: GC/CT, UCx  Radiology No new imaging  Assessment & Plan:  Pt doing  well *GYN: will do 24 hours of IV abx and then transition to full PO course. Will do augmentin xr 2000mg  bid. This is probably more broad spec then what she needs but she states she couldn't complete her earlier doxy/flagyl course that was given to her 2016 b/c of poor po tolerability, so in terms of best for pt compliance will do augmentin. If continuing to do well, then likely can go home tomorrow. Follow up pending lab studies *Pain: continue with PO PRNs *FEN/GI: regular diet, sliv *PPx: oob, scds *Dispo: see above.   Code Status: Full Code  Total time taking care of the patient was 15 minutes, with greater than 50% of the time spent in face to face interaction with the patient.  Durene Romans MD [facility] [place] [facility] GYN Consult Phone: [phone] (M-F, 0800-1700) & [phone] (Off hours, weekends, holidays)

## 2019-06-17 LAB — URINE CULTURE: Culture: 30000 — AB

## 2019-06-17 MED ORDER — AZITHROMYCIN 250 MG PO TABS
1000.0000 mg | ORAL_TABLET | Freq: Once | ORAL | Status: AC
  Administered 2019-06-17: 1000 mg via ORAL
  Filled 2019-06-17: qty 4

## 2019-06-17 MED ORDER — PROMETHAZINE HCL 25 MG PO TABS: 25.0000 mg | ORAL_TABLET | Freq: Four times a day (QID) | ORAL | 2 refills | Status: AC | PRN

## 2019-06-17 MED ORDER — AMOXICILLIN-POT CLAVULANATE ER 1000-62.5 MG PO TB12: 2.0000 | ORAL_TABLET | Freq: Two times a day (BID) | ORAL | 0 refills | Status: AC

## 2019-06-17 MED ORDER — PROMETHAZINE HCL 25 MG PO TABS: 25.0000 mg | ORAL_TABLET | Freq: Four times a day (QID) | ORAL | Status: DC | PRN

## 2019-06-17 NOTE — Progress Notes (Signed)
Gynecology Progress Note  Admission Date: 06/15/2019 Current Date: 06/17/2019 6:34 AM  Michele Mcdonald is a 22 y.o. G0 (LMP: 6/14) HD#3 admitted for b/l TOAs.   History complicated by: Patient Active Problem List   Diagnosis Date Noted  . PID (acute pelvic inflammatory disease) 06/15/2019  . TOA (tubo-ovarian abscess) 06/15/2019    ROS and patient/family/surgical history, located on admission H&P note dated 06/15/2019, have been reviewed, and there are no changes except as noted below Yesterday/Overnight Events:  None  Subjective:  Continues to do well and no issues with PO abx  Objective:    Current Vital Signs 24h Vital Sign Ranges  T 98.1 F (36.7 C) Temp  Avg: 98.2 F (36.8 C)  Min: 98.1 F (36.7 C)  Max: 98.3 F (36.8 C)  BP 120/86 BP  Min: 117/89  Max: 120/86  HR 84 Pulse  Avg: 85  Min: 84  Max: 86  RR 18 Resp  Avg: 18  Min: 18  Max: 18  SaO2 100 % Room Air SpO2  Avg: 100 %  Min: 100 %  Max: 100 %       24 Hour I/O Current Shift I/O  Time Ins Outs 06/20 0701 - 06/21 0700 In: 405.3 [P.O.:360; I.V.:40.4] Out: -  06/20 1901 - 06/21 0700 In: 360 [P.O.:360] Out: -     Patient Vitals for the past 24 hrs:  BP Temp Temp src Pulse Resp SpO2  06/17/19 0607 120/86 98.1 F (36.7 C) Oral 84 18 100 %  06/16/19 1510 117/89 98.3 F (36.8 C) Oral 86 18 100 %    Physical exam: General appearance: alert, cooperative and appears stated age Abdomen: soft, nttp, no peritoneal s/s Lungs: clear to auscultation bilaterally Heart: S1, S2 normal, no murmur, rub or gallop, regular rate and rhythm Extremities: no c/c/e Skin: warm and dry Psych: appropriate Neurologic: Grossly normal  Medications Current Facility-Administered Medications  Medication Dose Route Frequency Provider Last Rate Last Dose  . 0.9 %  sodium chloride infusion   Intravenous PRN Aletha Halim, MD   Stopped at 06/16/19 0902  . acetaminophen (TYLENOL) tablet 650 mg  650 mg Oral Q4H PRN Aletha Halim, MD       . amoxicillin-clavulanate (AUGMENTIN XR) 1000-62.5 MG per 12 hr tablet 2 tablet  2 tablet Oral Q12H Aletha Halim, MD   2 tablet at 06/16/19 2115  . ibuprofen (ADVIL) tablet 600 mg  600 mg Oral Q6H Aletha Halim, MD   600 mg at 06/17/19 0215  . ondansetron (ZOFRAN) injection 4 mg  4 mg Intravenous Once Quintella Reichert, MD      . ondansetron [facility]) tablet 4 mg  4 mg Oral Q6H PRN Aletha Halim, MD   4 mg at 06/16/19 2013   Or  . ondansetron [facility]) injection 4 mg  4 mg Intravenous Q6H PRN Aletha Halim, MD   4 mg at 06/15/19 2258  . oxyCODONE (Oxy IR/ROXICODONE) immediate release tablet 5-10 mg  5-10 mg Oral Q6H PRN Aletha Halim, MD      . polyethylene glycol (MIRALAX / GLYCOLAX) packet 17 g  17 g Oral Daily Aletha Halim, MD   17 g at 06/15/19 1508  . promethazine (PHENERGAN) tablet 25 mg  25 mg Oral Q6H PRN Aletha Halim, MD      . sodium chloride flush (NS) 0.9 % injection 10-40 mL  10-40 mL Intracatheter PRN Aletha Halim, MD          Labs  Pending: GC/CT NGTD: UCx  Radiology No new imaging  Assessment & Plan:  Pt doing well *GYN: s/p 24h of cefoxitin, doxy and flagyl. Currently D#2 of augmentin. Pt has no insurance. CM consulted  *Pain: continue with PO PRNs *FEN/GI: regular diet, sliv *PPx: oob, scds *Dispo: today after Rx needs met. inbasket message sent to clinic for 7mf/u.   Code Status: Full Code  Total time taking care of the patient was 15 minutes, with greater than 50% of the time spent in face to face interaction with the patient.  CDurene RomansMD [facility]ending [facility] [place] GYN Consult Phone: [phone](M-F, 0800-1700) & [phone](Off hours, weekends, holidays)

## 2019-06-17 NOTE — Discharge Summary (Addendum)
Physician Discharge Summary  Patient ID: Michele Mcdonald MRN: [id number] DOB/AGE: 25/04/1996 25 y.o.  Admit date: 06/15/2019 Discharge date: 06/17/2019  Admission and Discharge Diagnoses:  Active Problems:   PID (acute pelvic inflammatory disease)   TOA (tubo-ovarian abscess)  Discharged Condition: stable  Hospital Course: Michele Mcdonald is a 23 y.o. G0 admitted for PID/TOA. She was treated with IV antibiotics (Ceftriaxone 1 g IV x 1 followed by Cefoxitin. Doxycycline and Flagyl for over 24 hours). She was then transitioned to Augmentin, which she tolerated well. Also had Azithromycin 1000 mg po x 1.  She never had a fever, her abdominal/pelvic pain improved during hospitalization. On HD#2, she was deemed stable for discharge to home onoral antibiotic regimen and outpatient follow up. Of note, gonorrhea and chlamydia cultures were still pending at time of discharge but she already received appropriate treatment for these pathogens.   Significant Diagnostic Studies:  Results for orders placed or performed during the hospital encounter of 06/15/19 (from the past 72 hour(s))  RPR     Status: None   Collection Time: 06/15/19  8:52 AM  Result Value Ref Range   RPR Ser Ql Non Reactive Non Reactive    Comment: (NOTE) Performed At: [facility] [address] [place], [place] [id number] Jolene SchimkeNagendra Sanjai MD YQ:[id number]:[phone]   HIV antibody     Status: None   Collection Time: 06/15/19  8:52 AM  Result Value Ref Range   HIV Screen 4th Generation wRfx Non Reactive Non Reactive    Comment: (NOTE) Performed At: [facility] [address] [place], [place] [id number] Jolene SchimkeNagendra Sanjai MD WN:[id number]:[phone]   Comprehensive metabolic panel     Status: Abnormal   Collection Time: 06/15/19  8:52 AM  Result Value Ref Range   Sodium 137 135 - 145 mmol/L   Potassium 3.6 3.5 - 5.1 mmol/L   Chloride 101 98 - 111 mmol/L   CO2 25 22 - 32 mmol/L   Glucose, Bld 115 (H) 70 - 99 mg/dL   BUN 7 6 - 20  mg/dL   Creatinine, Ser 0.340.67 0.44 - 1.00 mg/dL   Calcium 8.9 8.9 - 74.210.3 mg/dL   Total Protein 7.8 6.5 - 8.1 g/dL   Albumin 4.1 3.5 - 5.0 g/dL   AST 15 15 - 41 U/L   ALT 9 0 - 44 U/L   Alkaline Phosphatase 63 38 - 126 U/L   Total Bilirubin 1.0 0.3 - 1.2 mg/dL   GFR calc non Af Amer >60 >60 mL/min   GFR calc Af Amer >60 >60 mL/min   Anion gap 11 5 - 15    Comment: Performed at [facility] [place], [address] [place], [place] [address]  CBC with Differential     Status: Abnormal   Collection Time: 06/15/19  8:52 AM  Result Value Ref Range   WBC 19.9 (H) 4.0 - 10.5 K/uL   RBC 4.34 3.87 - 5.11 MIL/uL   Hemoglobin 13.1 12.0 - 15.0 g/dL   HCT 87.539.9 64.336.0 - 32.946.0 %   MCV 91.9 80.0 - 100.0 fL   MCH 30.2 26.0 - 34.0 pg   MCHC 32.8 30.0 - 36.0 g/dL   RDW 51.813.1 84.111.5 - 66.015.5 %   Platelets 298 150 - 400 K/uL   nRBC 0.0 0.0 - 0.2 %   Neutrophils Relative % 78 %   Neutro Abs 15.7 (H) 1.7 - 7.7 K/uL   Lymphocytes Relative 10 %   Lymphs Abs 2.0 0.7 - 4.0 K/uL  Monocytes Relative 10 %   Monocytes Absolute 2.0 (H) 0.1 - 1.0 K/uL   Eosinophils Relative 1 %   Eosinophils Absolute 0.1 0.0 - 0.5 K/uL   Basophils Relative 0 %   Basophils Absolute 0.0 0.0 - 0.1 K/uL   Immature Granulocytes 1 %   Abs Immature Granulocytes 0.09 (H) 0.00 - 0.07 K/uL    Comment: Performed at [facility] [place], [address] [place], [place] [address]  Urine culture     Status: Abnormal   Collection Time: 06/15/19  8:53 AM   Specimen: Urine, Clean Catch  Result Value Ref Range   Specimen Description      URINE, CLEAN CATCH Performed at [facility] [place], [address] [place], [place] [address]    Special Requests      NONE Performed at [facility] [place], [address] [place], [place] [address]    Culture 30,000 COLONIES/mL STAPHYLOCOCCUS EPIDERMIDIS (A)    Report Status 06/17/2019 FINAL    Organism ID, Bacteria STAPHYLOCOCCUS EPIDERMIDIS (A)       Susceptibility   Staphylococcus  epidermidis - MIC*    CIPROFLOXACIN <=0.5 SENSITIVE Sensitive     GENTAMICIN <=0.5 SENSITIVE Sensitive     NITROFURANTOIN <=16 SENSITIVE Sensitive     OXACILLIN 2 RESISTANT Resistant     TETRACYCLINE <=1 SENSITIVE Sensitive     VANCOMYCIN 1 SENSITIVE Sensitive     TRIMETH/SULFA <=10 SENSITIVE Sensitive     CLINDAMYCIN >=8 RESISTANT Resistant     RIFAMPIN <=0.5 SENSITIVE Sensitive     Inducible Clindamycin NEGATIVE Sensitive     * 30,000 COLONIES/mL STAPHYLOCOCCUS EPIDERMIDIS  Wet prep, genital     Status: Abnormal   Collection Time: 06/15/19  9:58 AM   Specimen: Vaginal  Result Value Ref Range   Yeast Wet Prep HPF POC NONE SEEN NONE SEEN   Trich, Wet Prep NONE SEEN NONE SEEN   Clue Cells Wet Prep HPF POC PRESENT (A) NONE SEEN   WBC, Wet Prep HPF POC MANY (A) NONE SEEN   Sperm NONE SEEN     Comment: Performed at [facility] [place], [address] [place], [place] [address]  SARS Coronavirus 2 (Hosp order,Performed in [facility] lab via [employer] ID)     Status: None   Collection Time: 06/15/19 12:57 PM   Specimen: Dry Nasal Swab ([employer] ID Now)  Result Value Ref Range   SARS Coronavirus 2 ([employer] ID Now) NEGATIVE NEGATIVE    Comment: (NOTE) Interpretive Result Comment(s): COVID 19 Positive SARS CoV 2 target nucleic acids are DETECTED. The SARS CoV 2 RNA is generally detectable in upper and lower respiratory specimens during the acute phase of infection.  Positive results are indicative of active infection with SARS CoV 2.  Clinical correlation with patient history and other diagnostic information is necessary to determine patient infection status.  Positive results do not rule out bacterial infection or coinfection with other viruses. The expected result is Negative. COVID 19 Negative SARS CoV 2 target nucleic acids are NOT DETECTED. The SARS CoV 2 RNA is generally detectable in upper and lower respiratory specimens during the acute phase of infection.  Negative results  do not preclude SARS CoV 2 infection, do not rule out coinfections with other pathogens, and should not be used as the sole basis for treatment or other patient management decisions.  Negative results must be combined with clinical  observations, patient history, and epidemiological information. The expected  result is Negative. Invalid Presence or absence of SARS CoV 2 nucleic acids cannot be determined. Repeat testing was performed on the submitted specimen and repeated Invalid results were obtained.  If clinically indicated, additional testing on a new specimen with an alternate test methodology [phone]([id number]) is advised.  The SARS CoV 2 RNA is generally detectable in upper and lower respiratory specimens during the acute phase of infection. The expected result is Negative. Fact Sheet for Patients:  http://www.graves-ford.org/https://www.fda.gov/media/136524/download Fact Sheet for Healthcare Providers: EnviroConcern.sihttps://www.fda.gov/media/136523/download This test is not yet approved or cleared by the [place] FDA and has been authorized for detection and/or diagnosis of SARS CoV 2 by FDA under an Emergency Use Authorization (EUA).  This EUA will remain in effect (meaning this test can be used) for the duration of the COVID19 d eclaration under Section 564(b)(1) of the Act, 21 U.S.C. section [phone] 3(b)(1), unless the authorization is terminated or revoked sooner. Performed at [facility] [place], [address] [place], [place] [address]   APTT     Status: None   Collection Time: 06/16/19  4:07 AM  Result Value Ref Range   aPTT 35 24 - 36 seconds    Comment: Performed at [facility], [address] [place], [place] [address]  CBC WITH DIFFERENTIAL     Status: Abnormal   Collection Time: 06/16/19  4:07 AM  Result Value Ref Range   WBC 15.7 (H) 4.0 - 10.5 K/uL   RBC 3.50 (L) 3.87 - 5.11 MIL/uL   Hemoglobin 10.5 (L) 12.0 - 15.0 g/dL   HCT 62.132.1 (L) 30.836.0 - 65.746.0 %   MCV 91.7 80.0 - 100.0 fL   MCH  30.0 26.0 - 34.0 pg   MCHC 32.7 30.0 - 36.0 g/dL   RDW 84.613.0 96.211.5 - 95.215.5 %   Platelets 243 150 - 400 K/uL   nRBC 0.0 0.0 - 0.2 %   Neutrophils Relative % 67 %   Neutro Abs 10.7 (H) 1.7 - 7.7 K/uL   Lymphocytes Relative 21 %   Lymphs Abs 3.3 0.7 - 4.0 K/uL   Monocytes Relative 10 %   Monocytes Absolute 1.5 (H) 0.1 - 1.0 K/uL   Eosinophils Relative 1 %   Eosinophils Absolute 0.1 0.0 - 0.5 K/uL   Basophils Relative 0 %   Basophils Absolute 0.0 0.0 - 0.1 K/uL   Immature Granulocytes 1 %   Abs Immature Granulocytes 0.08 (H) 0.00 - 0.07 K/uL    Comment: Performed at [facility], [address] [place], [place] [address]  Protime-INR     Status: None   Collection Time: 06/16/19  4:07 AM  Result Value Ref Range   Prothrombin Time 15.2 11.4 - 15.2 seconds   INR 1.2 0.8 - 1.2    Comment: (NOTE) INR goal varies based on device and disease states. Performed at [facility], [address] [place], [place] [address]   Type and screen     Status: None   Collection Time: 06/16/19  4:20 AM  Result Value Ref Range   ABO/RH(D) O POS    Antibody Screen NEG    Sample Expiration      06/19/2019,2359 Performed at [facility], [address] [place], [place] [address]   ABO/Rh     Status: None   Collection Time: 06/16/19  4:20 AM  Result Value Ref Range   ABO/RH(D) O POS    No rh immune globuloin  NOT A RH IMMUNE GLOBULIN CANDIDATE, PT RH POSITIVE Performed at [facility], [place] [address] [place], [place] [address]    US Transvaginal Non-ob  Result Date: 06/15/2019 CLINICAL DATA:  Left lower quadrant pain. EXAM: TRANSABDOMINAL AND TRANSVAGINAL ULTRASOUND OF PELVIS DOPPLER ULTRASOUND OF OVARIES TECHNIQUE: Both transabdominal and transvaginal ultrasound examinations of the pelvis were performed. Transabdominal technique was performed for global imaging of the pelvis including uterus, ovaries, adnexal regions, and pelvic cul-de-sac. It was necessary to proceed  with endovaginal exam following the transabdominal exam to visualize the uterus and ovaries. Color and duplex Doppler ultrasound was utilized to evaluate blood flow to the ovaries. COMPARISON:  03/31/2019. FINDINGS: Uterus Measurements: 7.8 x 3.5 x 3.9 cm = volume: 54.4 mL. No fibroids or other mass visualized. Endometrium Thickness: 3.0.  No focal abnormality visualized. Right ovary Measurements: 2.4 x 1.6 x 3.4 cm = volume: 6.8 mL. No ovarian mass. Left ovary Measurements: 2.9 x 1.9 x 2.2 cm = volume: 6.5 mL.  No ovarian mass. Fluid filled tubular structures are again noted and both adnexal regions. These are most likely dilated fallopian tubes. Structure on the right measures 6.5 x 2.7 x 5.4 cm. Structure on the left measures 5.4 x 4.9 x 4.2 cm Pulsed Doppler evaluation of both ovaries demonstrates normal low-resistance arterial and venous waveforms. Other findings Small amount of free pelvic fluid. IMPRESSION: 1. Bilateral complex adnexal structures most likely dilated fallopian tubes. Findings again consistent with hydro or pyosalpinx. Similar findings noted on prior exam. 2.  Small amount of free pelvic fluid. Electronically Signed   By: Marcello Moores  Register   On: 06/15/2019 11:42   US Pelvis Complete  Result Date: 06/15/2019 CLINICAL DATA:  Left lower quadrant pain. EXAM: TRANSABDOMINAL AND TRANSVAGINAL ULTRASOUND OF PELVIS DOPPLER ULTRASOUND OF OVARIES TECHNIQUE: Both transabdominal and transvaginal ultrasound examinations of the pelvis were performed. Transabdominal technique was performed for global imaging of the pelvis including uterus, ovaries, adnexal regions, and pelvic cul-de-sac. It was necessary to proceed with endovaginal exam following the transabdominal exam to visualize the uterus and ovaries. Color and duplex Doppler ultrasound was utilized to evaluate blood flow to the ovaries. COMPARISON:  03/31/2019. FINDINGS: Uterus Measurements: 7.8 x 3.5 x 3.9 cm = volume: 54.4 mL. No fibroids or other  mass visualized. Endometrium Thickness: 3.0.  No focal abnormality visualized. Right ovary Measurements: 2.4 x 1.6 x 3.4 cm = volume: 6.8 mL. No ovarian mass. Left ovary Measurements: 2.9 x 1.9 x 2.2 cm = volume: 6.5 mL.  No ovarian mass. Fluid filled tubular structures are again noted and both adnexal regions. These are most likely dilated fallopian tubes. Structure on the right measures 6.5 x 2.7 x 5.4 cm. Structure on the left measures 5.4 x 4.9 x 4.2 cm Pulsed Doppler evaluation of both ovaries demonstrates normal low-resistance arterial and venous waveforms. Other findings Small amount of free pelvic fluid. IMPRESSION: 1. Bilateral complex adnexal structures most likely dilated fallopian tubes. Findings again consistent with hydro or pyosalpinx. Similar findings noted on prior exam. 2.  Small amount of free pelvic fluid. Electronically Signed   By: Marcello Moores  Register   On: 06/15/2019 11:42   [place] Art/ven Flow Abd Pelv Doppler  Result Date: 06/15/2019 CLINICAL DATA:  Left lower quadrant pain. EXAM: TRANSABDOMINAL AND TRANSVAGINAL ULTRASOUND OF PELVIS DOPPLER ULTRASOUND OF OVARIES TECHNIQUE: Both transabdominal and transvaginal ultrasound examinations of the pelvis were performed. Transabdominal technique was performed for global imaging of the pelvis including uterus, ovaries, adnexal regions, and pelvic  cul-de-sac. It was necessary to proceed with endovaginal exam following the transabdominal exam to visualize the uterus and ovaries. Color and duplex Doppler ultrasound was utilized to evaluate blood flow to the ovaries. COMPARISON:  03/31/2019. FINDINGS: Uterus Measurements: 7.8 x 3.5 x 3.9 cm = volume: 54.4 mL. No fibroids or other mass visualized. Endometrium Thickness: 3.0.  No focal abnormality visualized. Right ovary Measurements: 2.4 x 1.6 x 3.4 cm = volume: 6.8 mL. No ovarian mass. Left ovary Measurements: 2.9 x 1.9 x 2.2 cm = volume: 6.5 mL.  No ovarian mass. Fluid filled tubular structures are again  noted and both adnexal regions. These are most likely dilated fallopian tubes. Structure on the right measures 6.5 x 2.7 x 5.4 cm. Structure on the left measures 5.4 x 4.9 x 4.2 cm Pulsed Doppler evaluation of both ovaries demonstrates normal low-resistance arterial and venous waveforms. Other findings Small amount of free pelvic fluid. IMPRESSION: 1. Bilateral complex adnexal structures most likely dilated fallopian tubes. Findings again consistent with hydro or pyosalpinx. Similar findings noted on prior exam. 2.  Small amount of free pelvic fluid. Electronically Signed   By: Maisie Fus  Register   On: 06/15/2019 11:42     Discharge Exam (as per Dr. Oakman Bing): Blood pressure 120/86, pulse 84, temperature 98.1 F (36.7 C), temperature source Oral, resp. rate 18, height  (1.626 m), weight 68 kg, last menstrual period 06/10/2019, SpO2 100 %. General appearance: alert, cooperative and appears stated age Abdomen: soft, nttp, no peritoneal s/s Lungs: clear to auscultation bilaterally Heart: S1, S2 normal, no murmur, rub or gallop, regular rate and rhythm Extremities: no c/c/e Skin: warm and dry Psych: appropriate Neurologic: Grossly normal  Disposition: Stable to home   Allergies as of 06/17/2019   No Known Allergies     Medication List    TAKE these medications   acetaminophen 500 MG tablet Commonly known as: TYLENOL Take 1 tablet (500 mg total) by mouth every 6 (six) hours as needed.   amoxicillin-clavulanate 1000-62.5 MG 12 hr tablet Commonly known as: AUGMENTIN XR Take 2 tablets by mouth every 12 (twelve) hours for 14 days. Notes to patient: 06/17/2019-evening dose   ibuprofen 600 MG tablet Commonly known as: ADVIL Take 1 tablet (600 mg total) by mouth every 6 (six) hours as needed. What changed: Another medication with the same name was removed. Continue taking this medication, and follow the directions you see here. Notes to patient: 06/17/2019-after 4pm    multivitamin with minerals Tabs tablet Take 1 tablet by mouth daily. Notes to patient: Per home use   promethazine 25 MG tablet Commonly known as: PHENERGAN Take 1 tablet (25 mg total) by mouth every 6 (six) hours as needed for refractory nausea / vomiting.      Follow-up Information    [facility]. Schedule an appointment as soon as possible for a visit in 1 month(s).   Specialty: Obstetrics and Gynecology Why: call clinic in one week if you haven't heard about appointment Contact information: [address], [address] [id number] [place] [address] [phone]          Signed: Jaynie Collins, MD 06/17/2019, 2:28 PM

## 2019-06-17 NOTE — Discharge Instructions (Signed)
Pelvic Inflammatory Disease  Pelvic inflammatory disease (PID) is an infection in some or all of the female organs. PID can be in the uterus, ovaries, fallopian tubes, or the surrounding tissues that are inside the lower belly area (pelvis). PID can lead to lasting problems if it is not treated. To check for this disease, your doctor may:  · Do a physical exam.  · Do blood tests, urine tests, or a pregnancy test.  · Look at your vaginal discharge.  · Do tests to look inside the pelvis.  · Test you for other infections.  Follow these instructions at home:  · Take over-the-counter and prescription medicines only as told by your doctor.  · If you were prescribed an antibiotic medicine, take it as told by your doctor. Do not stop taking it even if you start to feel better.  · Do not have sex until treatment is done or as told by your doctor.  · Tell your sex partner if you have PID. Your partner may need to be treated.  · Keep all follow-up visits as told by your doctor. This is important.  · Your doctor may test you for infection again 3 months after you are treated.  Contact a doctor if:  · You have more fluid (discharge) coming from your vagina or fluid that is not normal.  · Your pain does not improve.  · You throw up (vomit).  · You have a fever.  · You cannot take your medicines.  · Your partner has a sexually transmitted disease (STD).  · You have pain when you pee (urinate).  Get help right away if:  · You have more belly (abdominal) or lower belly pain.  · You have chills.  · You are not better after 72 hours.  This information is not intended to replace advice given to you by your health care provider. Make sure you discuss any questions you have with your health care provider.  Document Released: 03/11/2009 Document Revised: 05/20/2016 Document Reviewed: 01/20/2015  Elsevier Interactive Patient Education © 2019 [employer]vier Inc.

## 2019-06-17 NOTE — Care Management (Signed)
Pt given [place] letter to fill prescriptions with copay of $3 each for a total of $6.  Patient states she can pay this and understands that she can take printed prescriptions and letter to any pharmacy on the list.

## 2019-06-17 NOTE — Progress Notes (Signed)
Shirlean Mylar to be D/C'd  per MD order. Discussed with the patient and all questions fully answered.  VSS, Skin clean, dry and intact without evidence of skin break down, no evidence of skin tears noted.  IV catheter discontinued intact. Site without signs and symptoms of complications. Dressing and pressure applied.  An After Visit Summary was printed and given to the patient. Patient received prescription.  D/c education completed with patient/family including follow up instructions, medication list, d/c activities limitations if indicated, with other d/c instructions as indicated by MD - patient able to verbalize understanding, all questions fully answered.   Patient instructed to return to ED, call 911, or call MD for any changes in condition.   Patient to be escorted via [place], and D/C home via private auto.

## 2019-06-18 LAB — GC/CHLAMYDIA PROBE AMP (~~LOC~~) NOT AT ARMC
Chlamydia: NEGATIVE
Neisseria Gonorrhea: NEGATIVE

## 2019-07-18 ENCOUNTER — Encounter: Payer: Self-pay | Admitting: Obstetrics & Gynecology

## 2019-07-18 ENCOUNTER — Encounter: Payer: Medicaid Other | Admitting: Obstetrics and Gynecology

## 2021-01-27 ENCOUNTER — Emergency Department (HOSPITAL_BASED_OUTPATIENT_CLINIC_OR_DEPARTMENT_OTHER)
Admission: EM | Admit: 2021-01-27 | Discharge: 2021-01-27 | Disposition: A | Discharge status: Home / Self Care | Payer: Medicaid Other | Attending: Emergency Medicine | Admitting: Emergency Medicine

## 2021-01-27 ENCOUNTER — Encounter (HOSPITAL_BASED_OUTPATIENT_CLINIC_OR_DEPARTMENT_OTHER): Payer: Self-pay

## 2021-01-27 ENCOUNTER — Other Ambulatory Visit: Payer: Self-pay

## 2021-01-27 DIAGNOSIS — H6981 Other specified disorders of Eustachian tube, right ear: Secondary | ICD-10-CM

## 2021-01-27 MED ORDER — FLUTICASONE PROPIONATE 50 MCG/ACT NA SUSP: 2.0000 | Freq: Every day | NASAL | 0 refills | Status: AC

## 2021-01-27 NOTE — ED Provider Notes (Signed)
[facility] EMERGENCY DEPARTMENT Provider Note   CSN: [id number] Arrival date & time: 01/27/21  1221     History Chief Complaint  Patient presents with  . Otalgia    Michele Mcdonald is a 25 y.o. female with past medical history of seasonal allergies that presents the emerge department today for right-sided ear pain for the past month.  Patient states that she has allergies, stopped taking her allergy medication for a while.  States that she normally wakes up with the pain in the right ear, is intermittent throughout the day.  States that also complains of sensation of ear fullness, does also admit to some slight hearing loss on that side.  States that she does have some tinnitus on that side, comes and goes, is not intermittent.  Does not appear pulsatile.  Denies any dizziness, headache, neck pain, syncopal episodes.  Denies any facial pain, congestion.  Denies any sore throat, cough.  Denies any fevers or chills.  Denies any otorrhea, outer ear pain.  States that left ear is normal.  Denies any recent swimming or elevation change that she is aware of.  No recent illness.  No one else having similar symptoms.  States that she is a generally healthy 25 year old.  No other complaints.  HPI     Past Medical History:  Diagnosis Date  . Seasonal allergies     Patient Active Problem List   Diagnosis Date Noted  . PID (acute pelvic inflammatory disease) 06/15/2019  . TOA (tubo-ovarian abscess) 06/15/2019    History reviewed. No pertinent surgical history.   OB History    Gravida  0   Para  0   Term  0   Preterm  0   AB  0   Living  0     SAB  0   IAB  0   Ectopic  0   Multiple  0   Live Births  0           No family history on file.  Social History   Tobacco Use  . Smoking status: Never Smoker  . Smokeless tobacco: Never Used  Vaping Use  . Vaping Use: Never used  Substance Use Topics  . Alcohol use: Yes    Comment: occ  . Drug use: No     Home Medications Prior to Admission medications   Medication Sig Start Date End Date Taking? Authorizing Provider  fluticasone (FLONASE) 50 MCG/ACT nasal spray Place 2 sprays into both nostrils daily for 10 days. 01/27/21 02/06/21 Yes Juliano Mceachin, Donne Anon, PA-C  acetaminophen (TYLENOL) 500 MG tablet Take 1 tablet (500 mg total) by mouth every 6 (six) hours as needed. Patient not taking: Reported on 06/15/2019 01/09/19   Emi Holes, PA-C  ibuprofen (ADVIL,MOTRIN) 600 MG tablet Take 1 tablet (600 mg total) by mouth every 6 (six) hours as needed. Patient not taking: Reported on 06/15/2019 01/09/19   Emi Holes, PA-C  Multiple Vitamin (MULTIVITAMIN WITH MINERALS) TABS tablet Take 1 tablet by mouth daily.    [provider]  promethazine (PHENERGAN) 25 MG tablet Take 1 tablet (25 mg total) by mouth every 6 (six) hours as needed for refractory nausea / vomiting. 06/17/19   Anyanwu, Jethro Bastos, MD    Allergies    Patient has no known allergies.  Review of Systems   Review of Systems  Constitutional: Negative for diaphoresis, fatigue and fever.  HENT: Positive for ear pain. Negative for congestion, dental problem, drooling, ear discharge,  facial swelling, hearing loss, postnasal drip, rhinorrhea, sinus pressure, sinus pain, sore throat, tinnitus, trouble swallowing and voice change.   Eyes: Negative for itching and visual disturbance.  Respiratory: Negative for shortness of breath.   Cardiovascular: Negative for chest pain.  Gastrointestinal: Negative for nausea and vomiting.  Musculoskeletal: Negative for back pain and myalgias.  Skin: Negative for color change, pallor, rash and wound.  Neurological: Negative for syncope, weakness, light-headedness, numbness and headaches.  Psychiatric/Behavioral: Negative for behavioral problems and confusion.    Physical Exam Updated Vital Signs BP 121/70 (BP Location: Right Arm)   Pulse 76   Temp 98.3 F (36.8 C) (Oral)   Resp 18   Ht 5\' 5"   (1.651 m)   Wt 74.8 kg   LMP 01/12/2021   SpO2 99%   BMI 27.46 kg/m   Physical Exam Constitutional:      General: She is not in acute distress.    Appearance: Normal appearance. She is not ill-appearing, toxic-appearing or diaphoretic.  HENT:     Head:     Comments: No facial swelling or facial tenderness.    Right Ear: Hearing, tympanic membrane, ear canal and external ear normal.     Left Ear: Hearing, tympanic membrane, ear canal and external ear normal.     Ears:     Comments: No outer ear tenderness bilaterally.  Right ear with tenderness upon otoscope insertion.  No erythema or edema noted.  Canal is clear.  TMs are pink and pearly.  Do not appear retracted.  No perforations noted.  They are not bulging. Left side normal.    Mouth/Throat:     Comments: No tooth pain. Cardiovascular:     Rate and Rhythm: Normal rate and regular rhythm.     Pulses: Normal pulses.  Pulmonary:     Effort: Pulmonary effort is normal.     Breath sounds: Normal breath sounds.  Musculoskeletal:        General: Normal range of motion.  Skin:    General: Skin is warm and dry.     Capillary Refill: Capillary refill takes less than 2 seconds.  Neurological:     General: No focal deficit present.     Mental Status: She is alert and oriented to person, place, and time.  Psychiatric:        Mood and Affect: Mood normal.        Behavior: Behavior normal.        Thought Content: Thought content normal.     ED Results / Procedures / Treatments   Labs (all labs ordered are listed, but only abnormal results are displayed) Labs Reviewed - No data to display  EKG None  Radiology No results found.  Procedures Procedures   Medications Ordered in ED Medications - No data to display  ED Course  I have reviewed the triage vital signs and the nursing notes.  Pertinent labs & imaging results that were available during my care of the patient were reviewed by me and considered in my medical  decision making (see chart for details).    MDM Rules/Calculators/A&P                         Michele Mcdonald is a 25 y.o. female with past medical history of seasonal allergies that presents the emerge department today for right-sided ear pain for the past month.  Patient appears very well, no concerns for infection.  Ear exam without any signs of  infection, I think patient most likely has eustachian tube dysfunction from allergies.  No red flag symptoms. Tinnitus does not appear pulsatile, is inconsistent and infrequent.   Patient to be discharged and follow-up with her PCP.  Symptomatic treatment discussed.  Patient be discharged at this time.Doubt need for further emergent work up at this time. I explained the diagnosis and have given explicit precautions to return to the ER including for any other new or worsening symptoms. The patient understands and accepts the medical plan as it's been dictated and I have answered their questions. Discharge instructions concerning home care and prescriptions have been given. The patient is STABLE and is discharged to home in good condition.    Final Clinical Impression(s) / ED Diagnoses Final diagnoses:  Dysfunction of right eustachian tube    Rx / DC Orders ED Discharge Orders         Ordered    fluticasone (FLONASE) 50 MCG/ACT nasal spray  Daily        01/27/21 1329           Farrel Gordon, PA-C 01/27/21 1342    Maia Plan, MD 01/27/21 [phone]

## 2021-01-27 NOTE — Discharge Instructions (Signed)
You are seen today for ear pain, as we discussed I think you have eustachian tube dysfunction, use the attached instructions to read more about this.  I want you to take the Flonase twice daily as directed, I also want you to take Zyrtec as we spoke about.  Please follow-up with your primary care in the next couple of days.  If you have any new or worsening concerning symptoms please come back to the emergency department. Contact a health care provider if: Your symptoms do not go away after treatment. Your symptoms come back after treatment. You are unable to pop your ears. You have: A fever. Pain in your ear. Pain in your head or neck. Fluid draining from your ear. Your hearing suddenly changes. You become very dizzy. You lose your balance.

## 2021-01-27 NOTE — ED Triage Notes (Signed)
Pt c/o right earache x 1 month-NAD-steady gait

## 2021-05-05 ENCOUNTER — Encounter: Payer: Self-pay | Admitting: *Deleted

## 2021-10-29 ENCOUNTER — Encounter: Payer: Self-pay | Admitting: *Deleted

## 2024-01-10 ENCOUNTER — Encounter (HOSPITAL_BASED_OUTPATIENT_CLINIC_OR_DEPARTMENT_OTHER): Payer: Self-pay | Admitting: Radiology

## 2024-01-10 ENCOUNTER — Other Ambulatory Visit: Payer: Self-pay

## 2024-01-10 ENCOUNTER — Emergency Department (HOSPITAL_BASED_OUTPATIENT_CLINIC_OR_DEPARTMENT_OTHER)
Admission: EM | Admit: 2024-01-10 | Discharge: 2024-01-10 | Disposition: A | Discharge status: Home / Self Care | Payer: No Typology Code available for payment source | Attending: Emergency Medicine | Admitting: Emergency Medicine

## 2024-01-10 DIAGNOSIS — S39012A Strain of muscle, fascia and tendon of lower back, initial encounter: Secondary | ICD-10-CM | POA: Diagnosis not present

## 2024-01-10 DIAGNOSIS — S199XXA Unspecified injury of neck, initial encounter: Secondary | ICD-10-CM | POA: Diagnosis present

## 2024-01-10 DIAGNOSIS — Y92410 Unspecified street and highway as the place of occurrence of the external cause: Secondary | ICD-10-CM | POA: Diagnosis not present

## 2024-01-10 DIAGNOSIS — S161XXA Strain of muscle, fascia and tendon at neck level, initial encounter: Secondary | ICD-10-CM | POA: Insufficient documentation

## 2024-01-10 LAB — URINALYSIS, ROUTINE W REFLEX MICROSCOPIC
Bilirubin Urine: NEGATIVE
Glucose, UA: NEGATIVE mg/dL
Ketones, ur: NEGATIVE mg/dL
Leukocytes,Ua: NEGATIVE
Nitrite: NEGATIVE
Protein, ur: NEGATIVE mg/dL
Specific Gravity, Urine: 1.01 (ref 1.005–1.030)
pH: 6.5 (ref 5.0–8.0)

## 2024-01-10 LAB — URINALYSIS, MICROSCOPIC (REFLEX)

## 2024-01-10 LAB — PREGNANCY, URINE: Preg Test, Ur: NEGATIVE

## 2024-01-10 MED ORDER — CYCLOBENZAPRINE HCL 10 MG PO TABS: 10.0000 mg | ORAL_TABLET | Freq: Two times a day (BID) | ORAL | 0 refills | Status: AC | PRN
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
## 2024-01-10 NOTE — ED Notes (Signed)
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
## 2024-03-23 ENCOUNTER — Emergency Department (HOSPITAL_BASED_OUTPATIENT_CLINIC_OR_DEPARTMENT_OTHER)
Admission: EM | Admit: 2024-03-23 | Discharge: 2024-03-23 | Discharge status: Against Medical Advice | Payer: Self-pay | Attending: Emergency Medicine | Admitting: Emergency Medicine

## 2024-03-23 ENCOUNTER — Encounter (HOSPITAL_BASED_OUTPATIENT_CLINIC_OR_DEPARTMENT_OTHER): Payer: Self-pay | Admitting: Emergency Medicine

## 2024-03-23 ENCOUNTER — Other Ambulatory Visit: Payer: Self-pay

## 2024-03-23 DIAGNOSIS — R197 Diarrhea, unspecified: Secondary | ICD-10-CM | POA: Insufficient documentation

## 2024-03-23 DIAGNOSIS — Z5321 Procedure and treatment not carried out due to patient leaving prior to being seen by health care provider: Secondary | ICD-10-CM | POA: Insufficient documentation

## 2024-03-23 LAB — URINALYSIS, MICROSCOPIC (REFLEX)

## 2024-03-23 LAB — I-STAT CHEM 8, ED
BUN: 14 mg/dL (ref 6–20)
Calcium, Ion: 1.15 mmol/L (ref 1.15–1.40)
Chloride: 99 mmol/L (ref 98–111)
Creatinine, Ser: 0.7 mg/dL (ref 0.44–1.00)
Glucose, Bld: 97 mg/dL (ref 70–99)
HCT: 37 % (ref 36.0–46.0)
Hemoglobin: 12.6 g/dL (ref 12.0–15.0)
Potassium: 3.6 mmol/L (ref 3.5–5.1)
Sodium: 137 mmol/L (ref 135–145)
TCO2: 22 mmol/L (ref 22–32)

## 2024-03-23 LAB — URINALYSIS, ROUTINE W REFLEX MICROSCOPIC
Bilirubin Urine: NEGATIVE
Glucose, UA: NEGATIVE mg/dL
Ketones, ur: NEGATIVE mg/dL
Leukocytes,Ua: NEGATIVE
Nitrite: NEGATIVE
Protein, ur: 30 mg/dL — AB
Specific Gravity, Urine: >=1.03 (ref 1.005–1.030)
pH: 5.5 (ref 5.0–8.0)

## 2024-03-23 LAB — CBC
HCT: 35.4 % — ABNORMAL LOW (ref 36.0–46.0)
Hemoglobin: 11.8 g/dL — ABNORMAL LOW (ref 12.0–15.0)
MCH: 30.5 pg (ref 26.0–34.0)
MCHC: 33.3 g/dL (ref 30.0–36.0)
MCV: 91.5 fL (ref 80.0–100.0)
Platelets: 350 10*3/uL (ref 150–400)
RBC: 3.87 MIL/uL (ref 3.87–5.11)
RDW: 13.5 % (ref 11.5–15.5)
WBC: 13.3 10*3/uL — ABNORMAL HIGH (ref 4.0–10.5)
nRBC: 0 % (ref 0.0–0.2)

## 2024-03-23 LAB — COMPREHENSIVE METABOLIC PANEL WITH GFR
ALT: 9 U/L (ref 0–44)
AST: 17 U/L (ref 15–41)
Albumin: 4.3 g/dL (ref 3.5–5.0)
Alkaline Phosphatase: 43 U/L (ref 38–126)
Anion gap: 6 (ref 5–15)
BUN: 15 mg/dL (ref 6–20)
CO2: 29 mmol/L (ref 22–32)
Calcium: 9 mg/dL (ref 8.9–10.3)
Chloride: 101 mmol/L (ref 98–111)
Creatinine, Ser: 0.65 mg/dL (ref 0.44–1.00)
GFR, Estimated: >60 mL/min (ref >60)
Glucose, Bld: 96 mg/dL (ref 70–99)
Potassium: 3.7 mmol/L (ref 3.5–5.1)
Sodium: 136 mmol/L (ref 135–145)
Total Bilirubin: 0.6 mg/dL (ref 0.0–1.2)
Total Protein: 6.6 g/dL (ref 6.5–8.1)

## 2024-03-23 NOTE — ED Notes (Signed)
 Called for room no response. Unable to find patient.

## 2024-03-23 NOTE — ED Notes (Signed)
Called in lobby x1 no answer 

## 2024-03-23 NOTE — ED Triage Notes (Signed)
 Pt reports ongoing diarrhea x 2d; reports 3 episodes of loose/watery stools today, denies recent ABT, fever, or n/v

## 2024-04-04 ENCOUNTER — Other Ambulatory Visit: Payer: Self-pay

## 2024-04-04 ENCOUNTER — Emergency Department (HOSPITAL_BASED_OUTPATIENT_CLINIC_OR_DEPARTMENT_OTHER)
Admission: EM | Admit: 2024-04-04 | Discharge: 2024-04-04 | Disposition: A | Discharge status: Home / Self Care | Payer: Self-pay | Attending: Emergency Medicine | Admitting: Emergency Medicine

## 2024-04-04 DIAGNOSIS — J020 Streptococcal pharyngitis: Secondary | ICD-10-CM | POA: Insufficient documentation

## 2024-04-04 DIAGNOSIS — R590 Localized enlarged lymph nodes: Secondary | ICD-10-CM | POA: Insufficient documentation

## 2024-04-04 LAB — RESP PANEL BY RT-PCR (RSV, FLU A&B, COVID)  RVPGX2
Influenza A by PCR: NEGATIVE
Influenza B by PCR: NEGATIVE
Resp Syncytial Virus by PCR: NEGATIVE
SARS Coronavirus 2 by RT PCR: NEGATIVE

## 2024-04-04 LAB — GROUP A STREP BY PCR: Group A Strep by PCR: DETECTED — AB

## 2024-04-04 MED ORDER — PENICILLIN V POTASSIUM 500 MG PO TABS: 500.0000 mg | ORAL_TABLET | Freq: Four times a day (QID) | ORAL | 0 refills | Status: AC

## 2024-04-04 NOTE — Discharge Instructions (Signed)
 Warm salt water gargles. Tylenol and ibuprofen.

## 2024-04-04 NOTE — ED Triage Notes (Signed)
 Pt reports sore throat and headache that developed Sunday night. Reports 7/10 throat pain.   Denies cold symptoms, fever, and nausea.

## 2024-04-04 NOTE — ED Provider Notes (Signed)
 [name] [facility] AT [facility] Provider Note   CSN: [id number] Arrival date & time: 04/04/24  0725     History  Chief Complaint  Patient presents with   Sore Throat    Michele Mcdonald is a 28 y.o. female.  She is complaining of sore throat and headache has been going on for the last 3 days.  She works at a nursing home.  No fever or cough no nausea or vomiting.  Throat pain is worse with swallowing.  Denies any chance of pregnancy.  Has tried nothing for her symptoms. No blurry or double vision, numbness weakness.  The history is provided by the patient.  Sore Throat This is a new problem. The current episode started more than 2 days ago. The problem occurs constantly. The problem has not changed since onset.Associated symptoms include headaches. Pertinent negatives include no chest pain, no abdominal pain and no shortness of breath. The symptoms are aggravated by swallowing. Nothing relieves the symptoms. She has tried nothing for the symptoms. The treatment provided no relief.       Home Medications Prior to Admission medications   Medication Sig Start Date End Date Taking? Authorizing Provider  acetaminophen (TYLENOL) 500 MG tablet Take 1 tablet (500 mg total) by mouth every 6 (six) hours as needed. Patient not taking: Reported on 06/15/2019 01/09/19   Emi Holes, PA-C  cyclobenzaprine (FLEXERIL) 10 MG tablet Take 1 tablet (10 mg total) by mouth 2 (two) times daily as needed for muscle spasms. 01/10/24   Schutt, Edsel Petrin, PA-C  fluticasone (FLONASE) 50 MCG/ACT nasal spray Place 2 sprays into both nostrils daily for 10 days. 01/27/21 02/06/21  Farrel Gordon, PA-C  ibuprofen (ADVIL,MOTRIN) 600 MG tablet Take 1 tablet (600 mg total) by mouth every 6 (six) hours as needed. Patient not taking: Reported on 06/15/2019 01/09/19   Emi Holes, PA-C  Multiple Vitamin (MULTIVITAMIN WITH MINERALS) TABS tablet Take 1 tablet by mouth daily.    [provider]  promethazine (PHENERGAN) 25 MG tablet Take 1 tablet (25 mg total) by mouth every 6 (six) hours as needed for refractory nausea / vomiting. 06/17/19   Anyanwu, Jethro Bastos, MD      Allergies    Patient has no known allergies.    Review of Systems   Review of Systems  Constitutional:  Negative for fever.  HENT:  Positive for sore throat. Negative for rhinorrhea.   Eyes:  Negative for visual disturbance.  Respiratory:  Negative for shortness of breath.   Cardiovascular:  Negative for chest pain.  Gastrointestinal:  Negative for abdominal pain.  Neurological:  Positive for headaches.    Physical Exam Updated Vital Signs BP 114/66   Pulse 92   Temp 98.2 F (36.8 C) (Oral)   Resp 18   LMP 03/09/2024 (Approximate)   SpO2 100%  Physical Exam Vitals and nursing note reviewed.  Constitutional:      General: She is not in acute distress.    Appearance: She is well-developed.  HENT:     Head: Normocephalic and atraumatic.     Nose: No congestion or rhinorrhea.     Mouth/Throat:     Mouth: Mucous membranes are moist.     Pharynx: Posterior oropharyngeal erythema present. No oropharyngeal exudate.     Tonsils: No tonsillar exudate or tonsillar abscesses.  Eyes:     Conjunctiva/sclera: Conjunctivae normal.  Cardiovascular:     Rate and Rhythm: Normal rate and regular rhythm.  Heart sounds: No murmur heard. Pulmonary:     Effort: Pulmonary effort is normal. No respiratory distress.     Breath sounds: Normal breath sounds.  Abdominal:     Palpations: Abdomen is soft.     Tenderness: There is no abdominal tenderness.  Musculoskeletal:        General: No swelling.     Cervical back: Neck supple.  Lymphadenopathy:     Cervical: Cervical adenopathy present.  Skin:    General: Skin is warm and dry.     Capillary Refill: Capillary refill takes less than 2 seconds.  Neurological:     General: No focal deficit present.     Mental Status: She is alert.     ED  Results / Procedures / Treatments   Labs (all labs ordered are listed, but only abnormal results are displayed) Labs Reviewed  GROUP A STREP BY PCR - Abnormal; Notable for the following components:      Result Value   Group A Strep by PCR DETECTED (*)    All other components within normal limits  RESP PANEL BY RT-PCR (RSV, FLU A&B, COVID)  [id number]    EKG None  Radiology No results found.  Procedures Procedures    Medications Ordered in ED Medications - No data to display  ED Course/ Medical Decision Making/ A&P Clinical Course as of 04/04/24 1709  Wed Apr 04, 2024  0832 Patient updated on results of labs and will need antibiotics for positive strep. [MB]    Clinical Course User Index [MB] Terrilee Files, MD                                 Medical Decision Making Risk Prescription drug management.   This patient complains of sore throat; this involves an extensive number of treatment Options and is a complaint that carries with it a high risk of complications and morbidity. The differential includes strep, viral, mono, COVID and flu  I ordered, reviewed and interpreted labs, which included COVID flu negative, group A strep positive Previous records obtained and reviewed in epic no recent admissions Social determinants considered, no significant barriers Critical Interventions: None  After the interventions stated above, I reevaluated the patient and found patient to be nontoxic-appearing Admission and further testing considered, no indications for admission or further workup.  Will cover with antibiotics.  Symptomatic treatment discussed.  Return instructions discussed         Final Clinical Impression(s) / ED Diagnoses Final diagnoses:  Strep throat    Rx / DC Orders ED Discharge Orders          Ordered    penicillin v potassium (VEETID) 500 MG tablet  4 times daily        04/04/24 0818              Terrilee Files, MD 04/04/24  1711

## 2024-09-12 ENCOUNTER — Encounter (HOSPITAL_BASED_OUTPATIENT_CLINIC_OR_DEPARTMENT_OTHER): Payer: Self-pay

## 2024-09-12 ENCOUNTER — Emergency Department (HOSPITAL_BASED_OUTPATIENT_CLINIC_OR_DEPARTMENT_OTHER)
Admission: EM | Admit: 2024-09-12 | Discharge: 2024-09-12 | Disposition: A | Discharge status: Home / Self Care | Payer: Self-pay | Attending: Emergency Medicine | Admitting: Emergency Medicine

## 2024-09-12 ENCOUNTER — Other Ambulatory Visit: Payer: Self-pay

## 2024-09-12 DIAGNOSIS — H109 Unspecified conjunctivitis: Secondary | ICD-10-CM | POA: Insufficient documentation

## 2024-09-12 MED ORDER — ERYTHROMYCIN 5 MG/GM OP OINT: TOPICAL_OINTMENT | OPHTHALMIC | 0 refills | Status: AC

## 2024-09-12 MED ORDER — TETRACAINE HCL 0.5 % OP SOLN
2.0000 [drp] | Freq: Once | OPHTHALMIC | Status: AC
  Administered 2024-09-12: 2 [drp] via OPHTHALMIC
  Filled 2024-09-12: qty 4

## 2024-09-12 MED ORDER — FLUORESCEIN SODIUM 1 MG OP STRP
1.0000 | ORAL_STRIP | Freq: Once | OPHTHALMIC | Status: AC
  Administered 2024-09-12: 1 via OPHTHALMIC
  Filled 2024-09-12: qty 1

## 2024-09-12 NOTE — ED Provider Notes (Signed)
 [facility] Provider Note   CSN: [id number] Arrival date & time: 09/12/24  9272     Patient presents with: Eye Pain   Michele Mcdonald is a 28 y.o. female who presents to the ED for left eye burning and swelling that started abruptly last night. She stated she was out celebrating a friend's birthday when she noticed her eye start burning and draining. She denies any exposure to recent sick contacts but states one of her friend's also had a swollen eye yesterday. She denies eye trauma, or foreign bodies to the eye. Patient says the pain and burning has continued to worsen overnight and when she awoke this morning, her eye was crusted shut. She described green pus coming out of the eye this morning. She has used clear eyes eye drops for redness relief this morning.   Eye Pain       Prior to Admission medications   Medication Sig Start Date End Date Taking? Authorizing Provider  erythromycin  ophthalmic ointment Place a 1/2 inch ribbon of ointment into the lower eyelid. 09/12/24  Yes Aberman, Caroline C, PA-C  acetaminophen  (TYLENOL ) 500 MG tablet Take 1 tablet (500 mg total) by mouth every 6 (six) hours as needed. Patient not taking: Reported on 06/15/2019 01/09/19   Rendell Lorane HERO, PA-C  cyclobenzaprine  (FLEXERIL ) 10 MG tablet Take 1 tablet (10 mg total) by mouth 2 (two) times daily as needed for muscle spasms. 01/10/24   Schutt, Marsa HERO, PA-C  fluticasone  (FLONASE ) 50 MCG/ACT nasal spray Place 2 sprays into both nostrils daily for 10 days. 01/27/21 02/06/21  Patel, Shalyn, PA-C  ibuprofen  (ADVIL ,MOTRIN ) 600 MG tablet Take 1 tablet (600 mg total) by mouth every 6 (six) hours as needed. Patient not taking: Reported on 06/15/2019 01/09/19   Rendell Lorane HERO, PA-C  Multiple Vitamin (MULTIVITAMIN WITH MINERALS) TABS tablet Take 1 tablet by mouth daily.    [provider]  promethazine  (PHENERGAN ) 25 MG tablet Take 1 tablet (25 mg total) by  mouth every 6 (six) hours as needed for refractory nausea / vomiting. 06/17/19   Anyanwu, Ugonna A, MD    Allergies: Patient has no known allergies.    Review of Systems  Eyes:  Positive for pain.    Updated Vital Signs BP (!) 126/97   Pulse 81   Temp 98.4 F (36.9 C) (Oral)   Resp 18   Ht 5' 4 (1.626 m)   Wt 70.3 kg   SpO2 100%   BMI 26.61 kg/m   Physical Exam Constitutional:      General: She is awake.     Appearance: Normal appearance.  HENT:     Head: Normocephalic and atraumatic. No right periorbital erythema or left periorbital erythema.  Eyes:     General: Lids are normal. Gaze aligned appropriately. No visual field deficit.       Right eye: No foreign body, discharge or hordeolum.        Left eye: No foreign body, discharge or hordeolum.     Extraocular Movements: Extraocular movements intact.     Left eye: Normal extraocular motion and no nystagmus.     Conjunctiva/sclera:     Right eye: Right conjunctiva is not injected.     Left eye: Left conjunctiva is injected. No exudate or hemorrhage.    Pupils: Pupils are equal, round, and reactive to light.     Comments: Minimal superficial swelling to superior L eyelid   Cardiovascular:  Rate and Rhythm: Normal rate.  Pulmonary:     Effort: Pulmonary effort is normal.  Skin:    General: Skin is warm and dry.  Neurological:     Mental Status: She is alert and oriented to person, place, and time.  Psychiatric:        Behavior: Behavior is cooperative.     (all labs ordered are listed, but only abnormal results are displayed) Labs Reviewed - No data to display  EKG: None  Radiology: No results found.    Medications Ordered in the ED  fluorescein  ophthalmic strip 1 strip (1 strip Left Eye Given by Other 09/12/24 0952)  tetracaine  (PONTOCAINE) 0.5 % ophthalmic solution 2 drop (2 drops Left Eye Given by Other 09/12/24 9047)                                Medical Decision Making This patient presents to  the ED for concern of eye pain, erythema and drainage. This involves an extensive number of treatment options, and is a complaint that carries with it a high risk of complications and morbidity.  Possible differentials include viral/bacterial conjunctivitis, pre-septal cellulitis, foreign body, corneal abrasion. Given that there are no changes to the patient's vision and there is no evidence of insect bite or eye trauma, suspicion for pre-septal cellulitis is low. No systemic symptoms including fever, body aches, suspicion for orbital cellulitis is low. 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 No evidence of foreign body. No fluorescein  uptake on slit lamp exam so corneal abrasion unlikely. IOP normal (21 on left eye, 20 on right) so low suspicion for glaucoma. Visual acuity 20/25 on left eye, 20/20 on right. Given green colored drainage will treat for conjunctivitis with erythromycin  ointment.     Lab Tests:  No labs test or imaging studies indicated   Medicines ordered and prescription drug management:  I ordered medication including Medications fluorescein  ophthalmic strip 1 strip (1 strip Left Eye Given by Other 09/12/24 0952) tetracaine  (PONTOCAINE) 0.5 % ophthalmic solution 2 drop (2 drops Left Eye Given by Other 09/12/24 0952) for slit lamp examination.  Reevaluation of the patient after these medicines showed that the patient improved I have reviewed the patients home medicines and have made adjustments as needed   Critical Interventions:       None  Consultations Obtained: None  Problem List / ED Course:       Conjunctivitis  MDM:  Doubt preseptal orbital cellulitis because no changes to the patient's vision and no significant periorbital swelling.  No systemic symptoms including fever, body aches, suspicion for orbital cellulitis is low. 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  No evidence of foreign body.  No fluorescein  uptake on slit lamp exam so corneal abrasion unlikely.  IOP normal (21 on left eye, 20 on right) so low suspicion for glaucoma. Visual acuity 20/25 on left eye, 20/20 on right.  Given green colored drainage will treat for conjunctivitis with erythromycin  ointment.  Dispostion:  After consideration of the diagnostic results and the patients response to treatment, I feel that the patient is stable for discharge with prescription for erythromycin  ointment for 5 days. IF symptoms continue to worsen after 2-3 days, she should seek appointment with ophthalmology.    Risk Prescription drug  management.    Final diagnoses:  Conjunctivitis of left eye, unspecified conjunctivitis type    ED Discharge Orders          Ordered    erythromycin  ophthalmic ointment        09/12/24 1050               Torrence Marry RAMAN, PA-C 09/12/24 1105    Rogelia Jerilynn RAMAN, MD 09/13/24 [phone]

## 2024-09-12 NOTE — ED Triage Notes (Signed)
 Reports L eye irritation, redness and pus like drainage since this morning.

## 2024-09-12 NOTE — Discharge Instructions (Addendum)
 It was a pleasure taking care of you today.  As discussed, I suspect you have an infection in your eye.  I am sending you home with antibiotic ointment.  Use as prescribed.  I have included the number of the eye doctor.  Please call if symptoms do not improve in 2 to 3 days.  Return to the ER for any new or worsening symptoms.
